# Patient Record
Sex: Female | Born: 1935
Health system: Southern US, Community
[De-identification: ages and names within clinical notes are randomized; demographics above are authoritative.]

## PROBLEM LIST (undated history)

## (undated) DIAGNOSIS — R600 Localized edema: Secondary | ICD-10-CM

## (undated) DIAGNOSIS — E785 Hyperlipidemia, unspecified: Secondary | ICD-10-CM

## (undated) DIAGNOSIS — I10 Essential (primary) hypertension: Secondary | ICD-10-CM

## (undated) DIAGNOSIS — J449 Chronic obstructive pulmonary disease, unspecified: Secondary | ICD-10-CM

## (undated) HISTORY — PX: STAPEDECTOMY: SHX2435

## (undated) HISTORY — PX: HERNIA REPAIR: SHX51

---

## 2004-07-07 ENCOUNTER — Ambulatory Visit: Payer: Self-pay | Admitting: Pain Medicine

## 2004-07-20 ENCOUNTER — Ambulatory Visit: Payer: Self-pay | Admitting: Pain Medicine

## 2004-09-01 ENCOUNTER — Ambulatory Visit: Payer: Self-pay | Admitting: Pain Medicine

## 2004-09-14 ENCOUNTER — Ambulatory Visit: Payer: Self-pay | Admitting: Pain Medicine

## 2004-10-20 ENCOUNTER — Ambulatory Visit: Payer: Self-pay | Admitting: Pain Medicine

## 2004-11-02 ENCOUNTER — Ambulatory Visit: Payer: Self-pay | Admitting: Pain Medicine

## 2005-03-26 ENCOUNTER — Ambulatory Visit: Payer: Self-pay | Admitting: Internal Medicine

## 2005-07-28 ENCOUNTER — Ambulatory Visit: Payer: Self-pay | Admitting: Gastroenterology

## 2005-08-10 ENCOUNTER — Ambulatory Visit: Payer: Self-pay | Admitting: General Surgery

## 2005-08-10 ENCOUNTER — Other Ambulatory Visit: Payer: Self-pay

## 2005-08-16 ENCOUNTER — Ambulatory Visit: Payer: Self-pay | Admitting: General Surgery

## 2005-11-01 ENCOUNTER — Ambulatory Visit: Payer: Self-pay | Admitting: Gastroenterology

## 2005-11-23 ENCOUNTER — Ambulatory Visit: Payer: Self-pay | Admitting: Pain Medicine

## 2005-12-01 ENCOUNTER — Ambulatory Visit: Payer: Self-pay | Admitting: Pain Medicine

## 2006-02-07 ENCOUNTER — Ambulatory Visit: Payer: Self-pay | Admitting: Gastroenterology

## 2006-03-28 ENCOUNTER — Ambulatory Visit: Payer: Self-pay | Admitting: Internal Medicine

## 2006-10-12 ENCOUNTER — Other Ambulatory Visit: Payer: Self-pay

## 2006-10-12 ENCOUNTER — Ambulatory Visit: Payer: Self-pay | Admitting: Obstetrics and Gynecology

## 2006-10-24 ENCOUNTER — Ambulatory Visit: Payer: Self-pay | Admitting: Obstetrics and Gynecology

## 2007-02-20 ENCOUNTER — Ambulatory Visit: Payer: Self-pay | Admitting: Gastroenterology

## 2007-11-22 ENCOUNTER — Ambulatory Visit: Payer: Self-pay | Admitting: Internal Medicine

## 2008-08-13 IMAGING — CR DG CHEST 2V
1 series · 2 of 2 positions shown · non-contrast
Comparison: none

REASON FOR EXAM: sob,htn
COMMENTS:

[Series 1: view not recorded · 0.17mm/px · 2 of 2 slices shown]
[im 1/2]
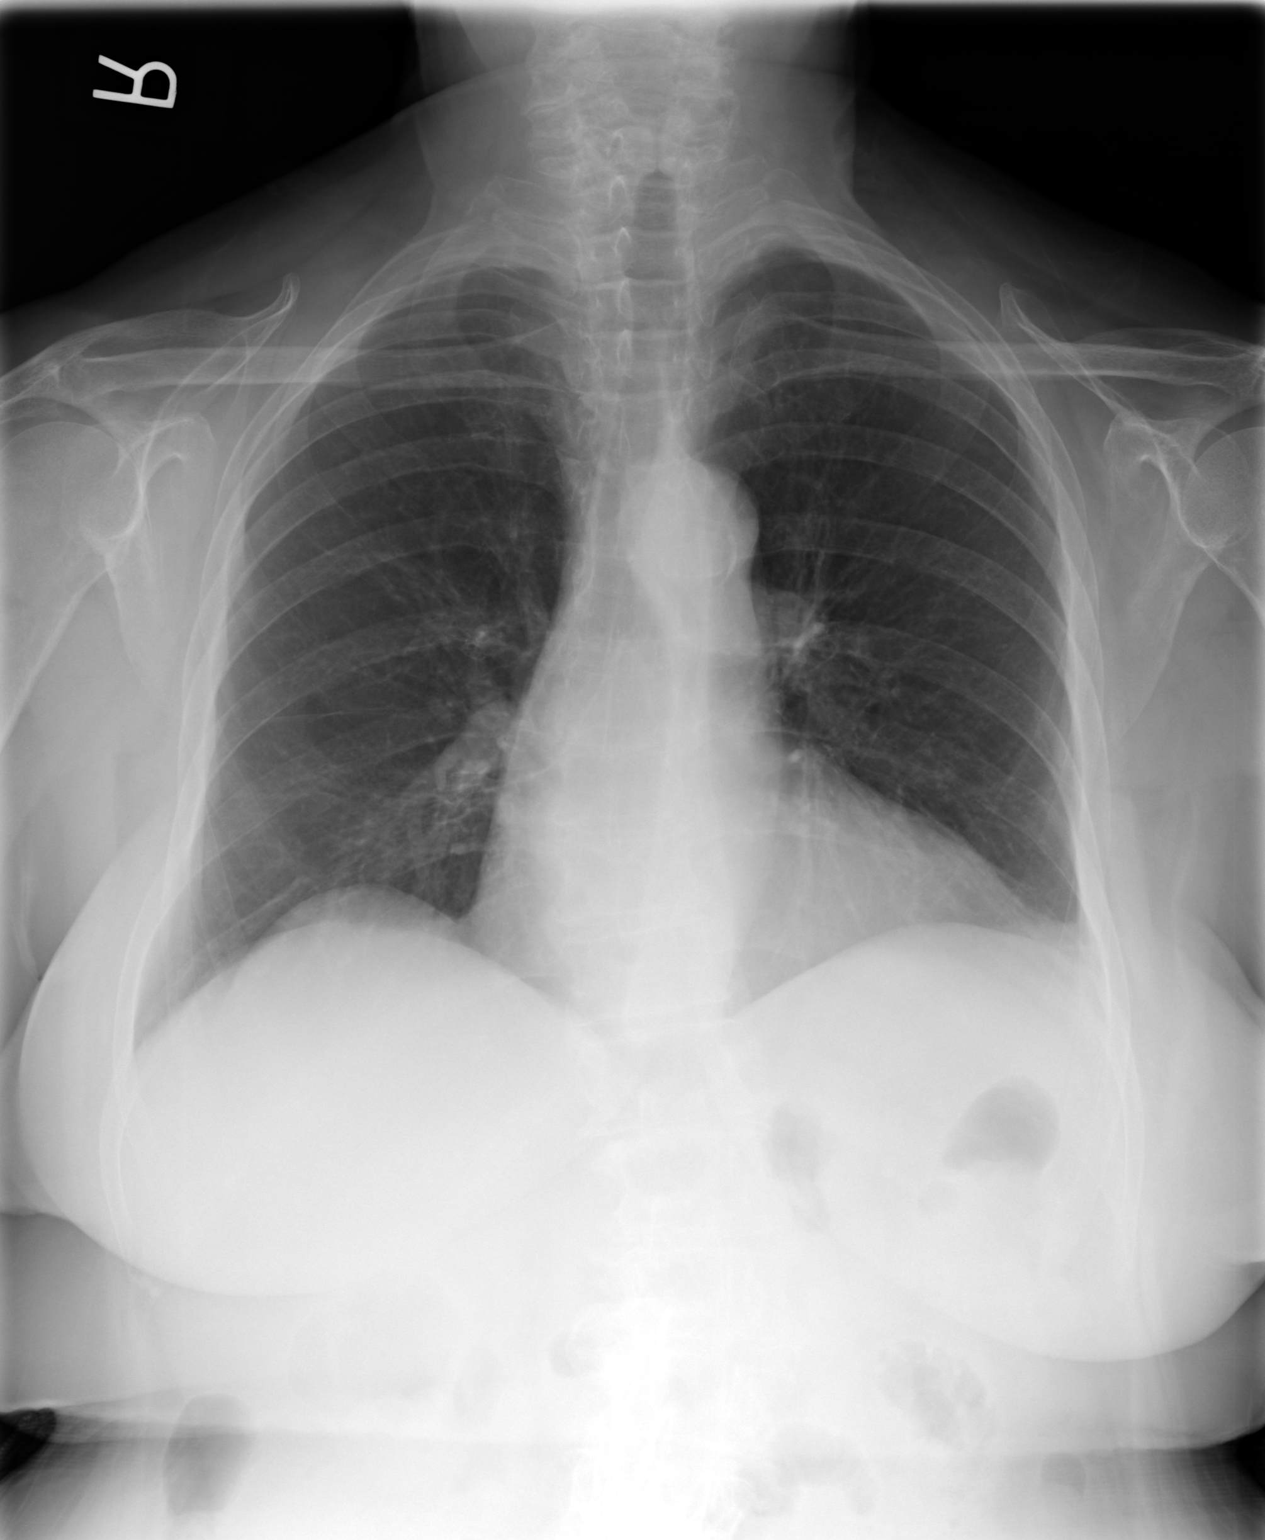
[im 2/2]
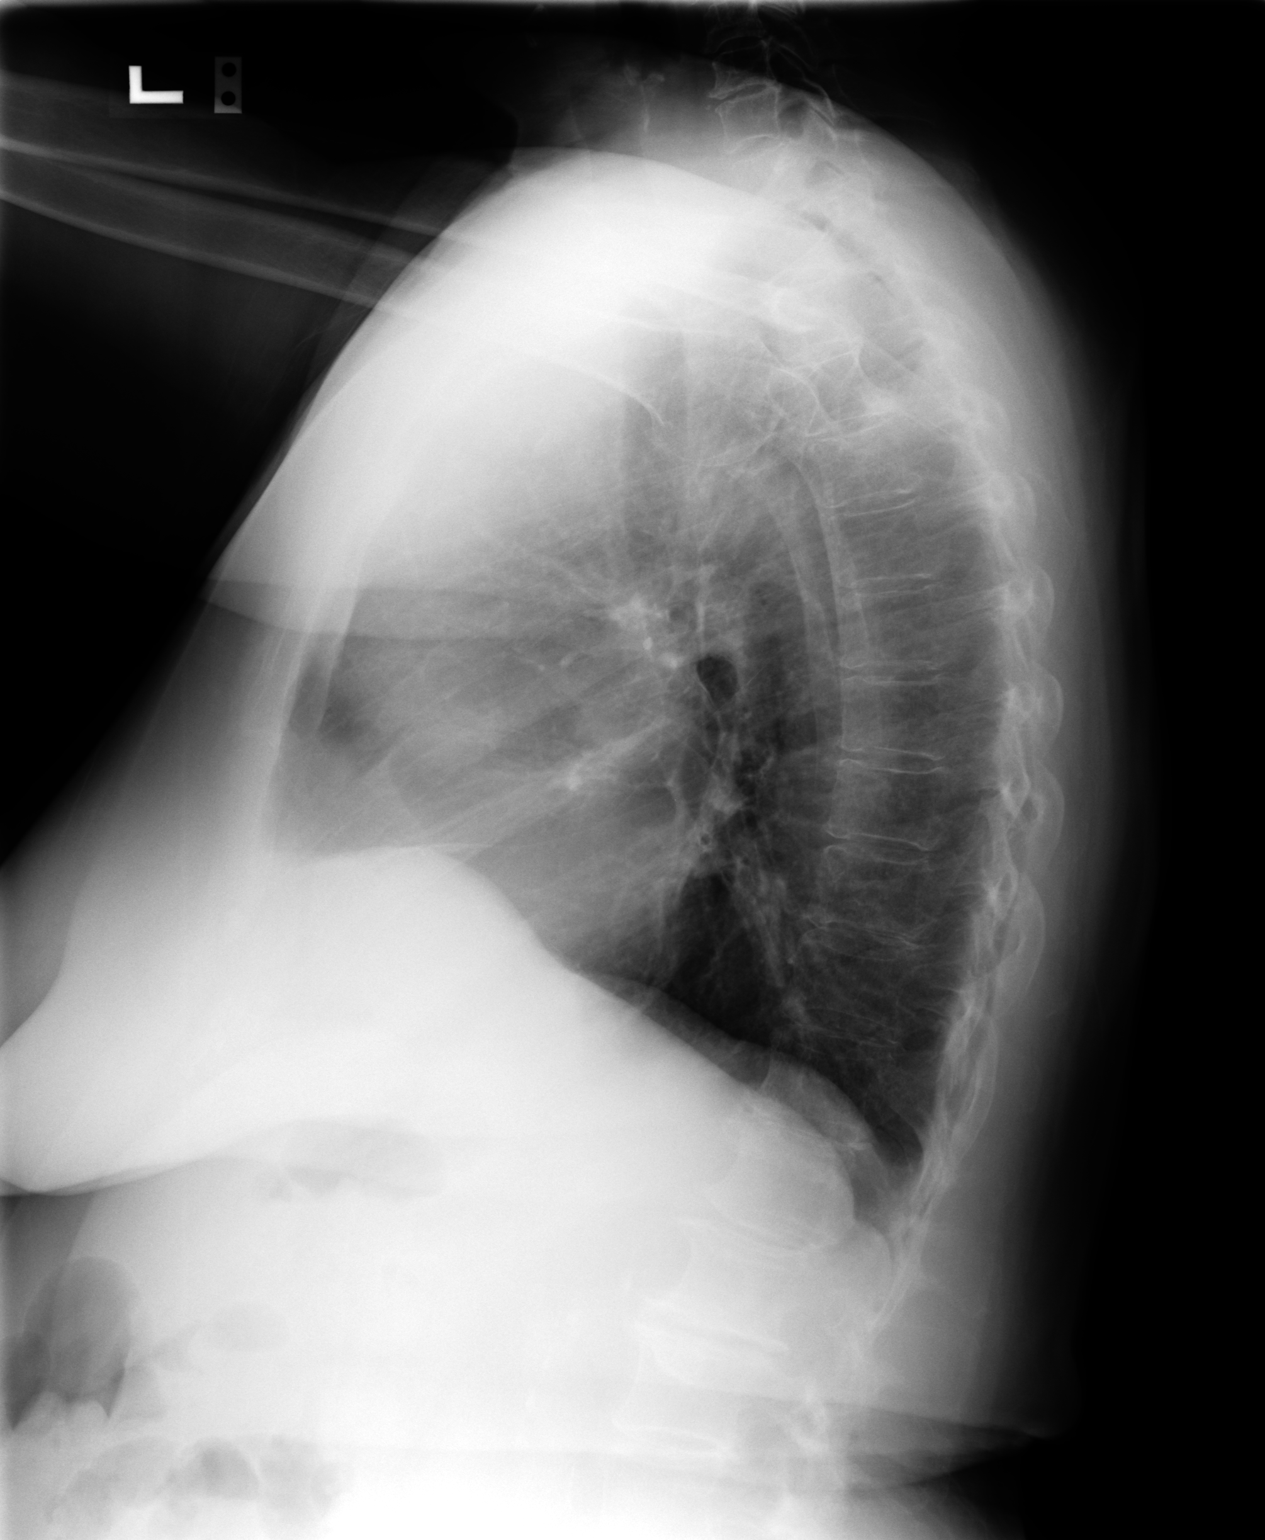

[2 of 2 positions shown; findings below may reference images not displayed]

PROCEDURE:     DXR - DXR CHEST PA (OR AP) AND LATERAL  - October 12, 2006 [DATE]

RESULT:       Comparison is made to a study 10 August, 2005.

The lungs are adequately inflated. Mild hemidiaphragm flattening on the
lateral film is seen but this is not a new finding. The cardiac silhouette
is at the upper limits of normal for size but this too appears stable. The
pulmonary vascularity is not engorged. I see no evidence of a pleural
effusion. There is mild tortuosity of the descending thoracic aorta.
IMPRESSION: I do not see evidence of acute cardiopulmonary disease.

## 2008-11-22 ENCOUNTER — Ambulatory Visit: Payer: Self-pay | Admitting: Internal Medicine

## 2009-12-05 ENCOUNTER — Ambulatory Visit: Payer: Self-pay | Admitting: Internal Medicine

## 2011-02-09 ENCOUNTER — Ambulatory Visit: Payer: Self-pay | Admitting: Internal Medicine

## 2012-02-11 ENCOUNTER — Ambulatory Visit: Payer: Self-pay | Admitting: Internal Medicine

## 2020-07-08 ENCOUNTER — Ambulatory Visit: Payer: Self-pay

## 2020-07-15 ENCOUNTER — Ambulatory Visit: Payer: Medicare Other | Attending: Internal Medicine

## 2020-07-15 ENCOUNTER — Other Ambulatory Visit: Payer: Self-pay | Admitting: Internal Medicine

## 2020-07-15 DIAGNOSIS — Z23 Encounter for immunization: Secondary | ICD-10-CM

## 2020-07-15 NOTE — Progress Notes (Signed)
   Covid-19 Vaccination Clinic  Name:  AVYA FLAVELL    MRN: [id number] DOB: Feb 01, 1927  07/15/2020  Ms. Quirion was observed post Covid-19 immunization for 15 minutes without incident. She was provided with Vaccine Information Sheet and instruction to access the V-Safe system.   Ms. Silverthorne was instructed to call 911 with any severe reactions post vaccine: Marland Kitchen Difficulty breathing  . Swelling of face and throat  . A fast heartbeat  . A bad rash all over body  . Dizziness and weakness   Immunizations Administered    Name Date Dose VIS Date Route   PFIZER Comrnaty(Gray TOP) Covid-19 Vaccine 07/15/2020  1:48 PM 0.3 mL 05/22/2020 Intramuscular   Manufacturer: [employer], [employer]   Lot: [id number]   NDC: [phone]

## 2020-12-05 ENCOUNTER — Ambulatory Visit: Payer: Self-pay | Attending: Internal Medicine

## 2020-12-05 ENCOUNTER — Other Ambulatory Visit: Payer: Self-pay

## 2020-12-05 ENCOUNTER — Ambulatory Visit: Payer: Self-pay

## 2020-12-05 DIAGNOSIS — Z23 Encounter for immunization: Secondary | ICD-10-CM

## 2020-12-05 MED ORDER — PFIZER-BIONT COVID-19 VAC-TRIS 30 MCG/0.3ML IM SUSP
INTRAMUSCULAR | 0 refills | Status: DC
  Filled 2020-12-05: qty 0.3, 1d supply, fill #0

## 2021-03-13 ENCOUNTER — Ambulatory Visit: Payer: Self-pay

## 2021-03-27 ENCOUNTER — Ambulatory Visit: Payer: Self-pay

## 2021-04-07 ENCOUNTER — Ambulatory Visit: Payer: Self-pay | Attending: Internal Medicine

## 2021-04-07 ENCOUNTER — Other Ambulatory Visit: Payer: Self-pay

## 2021-04-07 DIAGNOSIS — Z23 Encounter for immunization: Secondary | ICD-10-CM

## 2021-04-07 MED ORDER — PFIZER COVID-19 VAC BIVALENT 30 MCG/0.3ML IM SUSP
INTRAMUSCULAR | 0 refills | Status: DC
  Filled 2021-04-07: qty 0.3, 1d supply, fill #0

## 2021-04-07 NOTE — Progress Notes (Signed)
   Covid-19 Vaccination Clinic  Name:  Kelly Hawkins    MRN: [id number] DOB: Oct 12, 1926  04/07/2021  [name] was observed post Covid-19 immunization for 15 minutes without incident. She was provided with Vaccine Information Sheet and instruction to access the V-Safe system.   [name] was instructed to call 911 with any severe reactions post vaccine: Difficulty breathing  Swelling of face and throat  A fast heartbeat  A bad rash all over body  Dizziness and weakness   Immunizations Administered     Name Date Dose VIS Date Route   Pfizer Covid-19 Vaccine Bivalent Booster 04/07/2021  1:06 PM 0.3 mL 02/11/2021 Intramuscular   Manufacturer: [employer], [employer]   Lot: [id number]   NDC: [address]-8      Drusilla Kanner, PharmD, MBA Clinical Acute Care Pharmacist

## 2022-05-04 ENCOUNTER — Encounter: Payer: Self-pay | Admitting: Radiology

## 2022-05-04 ENCOUNTER — Other Ambulatory Visit: Payer: Self-pay

## 2022-05-04 ENCOUNTER — Emergency Department: Payer: Medicare Other

## 2022-05-04 ENCOUNTER — Observation Stay: Payer: Medicare Other

## 2022-05-04 ENCOUNTER — Observation Stay
Admit: 2022-05-04 | Discharge: 2022-05-04 | Disposition: A | Discharge status: Home / Self Care | Payer: Medicare Other | Attending: Cardiology | Admitting: Cardiology

## 2022-05-04 ENCOUNTER — Inpatient Hospital Stay
Admission: EM | Admit: 2022-05-04 | Discharge: 2022-05-08 | DRG: 069 | Disposition: A | Discharge status: Home Health | Payer: Medicare Other | Attending: Internal Medicine | Admitting: Internal Medicine

## 2022-05-04 DIAGNOSIS — Z79899 Other long term (current) drug therapy: Secondary | ICD-10-CM

## 2022-05-04 DIAGNOSIS — G0491 Myelitis, unspecified: Secondary | ICD-10-CM | POA: Diagnosis present

## 2022-05-04 DIAGNOSIS — J449 Chronic obstructive pulmonary disease, unspecified: Secondary | ICD-10-CM | POA: Diagnosis not present

## 2022-05-04 DIAGNOSIS — M7122 Synovial cyst of popliteal space [Baker], left knee: Secondary | ICD-10-CM | POA: Diagnosis present

## 2022-05-04 DIAGNOSIS — R29898 Other symptoms and signs involving the musculoskeletal system: Principal | ICD-10-CM

## 2022-05-04 DIAGNOSIS — I639 Cerebral infarction, unspecified: Secondary | ICD-10-CM | POA: Diagnosis present

## 2022-05-04 DIAGNOSIS — E785 Hyperlipidemia, unspecified: Secondary | ICD-10-CM | POA: Diagnosis present

## 2022-05-04 DIAGNOSIS — R29818 Other symptoms and signs involving the nervous system: Secondary | ICD-10-CM | POA: Diagnosis present

## 2022-05-04 DIAGNOSIS — I480 Paroxysmal atrial fibrillation: Secondary | ICD-10-CM | POA: Diagnosis present

## 2022-05-04 DIAGNOSIS — Z886 Allergy status to analgesic agent status: Secondary | ICD-10-CM

## 2022-05-04 DIAGNOSIS — R232 Flushing: Secondary | ICD-10-CM | POA: Diagnosis not present

## 2022-05-04 DIAGNOSIS — G459 Transient cerebral ischemic attack, unspecified: Principal | ICD-10-CM | POA: Diagnosis present

## 2022-05-04 DIAGNOSIS — I10 Essential (primary) hypertension: Secondary | ICD-10-CM | POA: Diagnosis present

## 2022-05-04 DIAGNOSIS — R600 Localized edema: Secondary | ICD-10-CM | POA: Diagnosis present

## 2022-05-04 DIAGNOSIS — R4789 Other speech disturbances: Secondary | ICD-10-CM

## 2022-05-04 DIAGNOSIS — Z8673 Personal history of transient ischemic attack (TIA), and cerebral infarction without residual deficits: Secondary | ICD-10-CM

## 2022-05-04 DIAGNOSIS — Z833 Family history of diabetes mellitus: Secondary | ICD-10-CM

## 2022-05-04 DIAGNOSIS — I4891 Unspecified atrial fibrillation: Secondary | ICD-10-CM | POA: Diagnosis not present

## 2022-05-04 DIAGNOSIS — Z7189 Other specified counseling: Secondary | ICD-10-CM

## 2022-05-04 DIAGNOSIS — Z515 Encounter for palliative care: Secondary | ICD-10-CM

## 2022-05-04 DIAGNOSIS — G8191 Hemiplegia, unspecified affecting right dominant side: Secondary | ICD-10-CM | POA: Diagnosis present

## 2022-05-04 DIAGNOSIS — H9190 Unspecified hearing loss, unspecified ear: Secondary | ICD-10-CM | POA: Diagnosis present

## 2022-05-04 DIAGNOSIS — I672 Cerebral atherosclerosis: Secondary | ICD-10-CM | POA: Diagnosis present

## 2022-05-04 DIAGNOSIS — R471 Dysarthria and anarthria: Secondary | ICD-10-CM | POA: Diagnosis present

## 2022-05-04 DIAGNOSIS — R29810 Facial weakness: Secondary | ICD-10-CM | POA: Diagnosis present

## 2022-05-04 DIAGNOSIS — R29708 NIHSS score 8: Secondary | ICD-10-CM | POA: Diagnosis present

## 2022-05-04 DIAGNOSIS — T45525A Adverse effect of antithrombotic drugs, initial encounter: Secondary | ICD-10-CM | POA: Diagnosis not present

## 2022-05-04 DIAGNOSIS — R4701 Aphasia: Secondary | ICD-10-CM | POA: Diagnosis present

## 2022-05-04 DIAGNOSIS — Z66 Do not resuscitate: Secondary | ICD-10-CM | POA: Diagnosis present

## 2022-05-04 DIAGNOSIS — L8990 Pressure ulcer of unspecified site, unspecified stage: Secondary | ICD-10-CM | POA: Insufficient documentation

## 2022-05-04 HISTORY — DX: Localized edema: R60.0

## 2022-05-04 HISTORY — DX: Hyperlipidemia, unspecified: E78.5

## 2022-05-04 HISTORY — DX: Chronic obstructive pulmonary disease, unspecified: J44.9

## 2022-05-04 HISTORY — DX: Essential (primary) hypertension: I10

## 2022-05-04 LAB — COMPREHENSIVE METABOLIC PANEL
ALT: 14 U/L (ref 0–44)
AST: 27 U/L (ref 15–41)
Albumin: 3.7 g/dL (ref 3.5–5.0)
Alkaline Phosphatase: 35 U/L — ABNORMAL LOW (ref 38–126)
Anion gap: 8 (ref 5–15)
BUN: 29 mg/dL — ABNORMAL HIGH (ref 8–23)
CO2: 26 mmol/L (ref 22–32)
Calcium: 9.8 mg/dL (ref 8.9–10.3)
Chloride: 108 mmol/L (ref 98–111)
Creatinine, Ser: 0.92 mg/dL (ref 0.44–1.00)
GFR, Estimated: 58 mL/min — ABNORMAL LOW (ref >60)
Glucose, Bld: 96 mg/dL (ref 70–99)
Potassium: 4 mmol/L (ref 3.5–5.1)
Sodium: 142 mmol/L (ref 135–145)
Total Bilirubin: 1.1 mg/dL (ref 0.3–1.2)
Total Protein: 6.8 g/dL (ref 6.5–8.1)

## 2022-05-04 LAB — DIFFERENTIAL
Abs Immature Granulocytes: 0.03 10*3/uL (ref 0.00–0.07)
Basophils Absolute: 0 10*3/uL (ref 0.0–0.1)
Basophils Relative: 0 %
Eosinophils Absolute: 0.1 10*3/uL (ref 0.0–0.5)
Eosinophils Relative: 1 %
Immature Granulocytes: 0 %
Lymphocytes Relative: 19 %
Lymphs Abs: 1.8 10*3/uL (ref 0.7–4.0)
Monocytes Absolute: 0.9 10*3/uL (ref 0.1–1.0)
Monocytes Relative: 10 %
Neutro Abs: 6.5 10*3/uL (ref 1.7–7.7)
Neutrophils Relative %: 70 %

## 2022-05-04 LAB — CBC
HCT: 37.7 % (ref 36.0–46.0)
Hemoglobin: 12.1 g/dL (ref 12.0–15.0)
MCH: 29.6 pg (ref 26.0–34.0)
MCHC: 32.1 g/dL (ref 30.0–36.0)
MCV: 92.2 fL (ref 80.0–100.0)
Platelets: 398 10*3/uL (ref 150–400)
RBC: 4.09 MIL/uL (ref 3.87–5.11)
RDW: 14.4 % (ref 11.5–15.5)
WBC: 9.3 10*3/uL (ref 4.0–10.5)
nRBC: 0 % (ref 0.0–0.2)

## 2022-05-04 LAB — HEMOGLOBIN A1C
Hgb A1c MFr Bld: 4.7 % — ABNORMAL LOW (ref 4.8–5.6)
Mean Plasma Glucose: 88.19 mg/dL

## 2022-05-04 LAB — ETHANOL: Alcohol, Ethyl (B): <10 mg/dL (ref <10)

## 2022-05-04 LAB — CBG MONITORING, ED: Glucose-Capillary: 88 mg/dL (ref 70–99)

## 2022-05-04 LAB — TSH: TSH: 0.882 u[IU]/mL (ref 0.350–4.500)

## 2022-05-04 LAB — APTT: aPTT: 27 seconds (ref 24–36)

## 2022-05-04 LAB — PROTIME-INR
INR: 1.1 (ref 0.8–1.2)
Prothrombin Time: 14.1 seconds (ref 11.4–15.2)

## 2022-05-04 LAB — BRAIN NATRIURETIC PEPTIDE: B Natriuretic Peptide: 212.6 pg/mL — ABNORMAL HIGH (ref 0.0–100.0)

## 2022-05-04 MED ORDER — HYDRALAZINE HCL 20 MG/ML IJ SOLN: 5.0000 mg | INTRAMUSCULAR | Status: DC | PRN

## 2022-05-04 MED ORDER — TRAMADOL HCL 50 MG PO TABS: 50.0000 mg | ORAL_TABLET | Freq: Three times a day (TID) | ORAL | Status: DC | PRN

## 2022-05-04 MED ORDER — STROKE: EARLY STAGES OF RECOVERY BOOK: Freq: Once | Status: DC

## 2022-05-04 MED ORDER — ACETAMINOPHEN 650 MG RE SUPP: 650.0000 mg | RECTAL | Status: DC | PRN

## 2022-05-04 MED ORDER — CLOPIDOGREL BISULFATE 75 MG PO TABS
75.0000 mg | ORAL_TABLET | Freq: Every day | ORAL | Status: DC
  Administered 2022-05-04: 75 mg via ORAL
  Filled 2022-05-04: qty 1

## 2022-05-04 MED ORDER — VITAMIN D3 25 MCG (1000 UNIT) PO TABS
2000.0000 [IU] | ORAL_TABLET | Freq: Every day | ORAL | Status: DC
  Administered 2022-05-04 – 2022-05-07 (×4): 2000 [IU] via ORAL
  Filled 2022-05-04 (×9): qty 2

## 2022-05-04 MED ORDER — ONDANSETRON HCL 4 MG/2ML IJ SOLN: 4.0000 mg | Freq: Three times a day (TID) | INTRAMUSCULAR | Status: DC | PRN

## 2022-05-04 MED ORDER — ACETAMINOPHEN 325 MG PO TABS: 650.0000 mg | ORAL_TABLET | ORAL | Status: DC | PRN

## 2022-05-04 MED ORDER — FENOFIBRATE 160 MG PO TABS
160.0000 mg | ORAL_TABLET | Freq: Every day | ORAL | Status: DC
  Administered 2022-05-04 – 2022-05-07 (×4): 160 mg via ORAL
  Filled 2022-05-04 (×4): qty 1

## 2022-05-04 MED ORDER — IOHEXOL 350 MG/ML SOLN
50.0000 mL | Freq: Once | INTRAVENOUS | Status: AC | PRN
  Administered 2022-05-04: 50 mL via INTRAVENOUS

## 2022-05-04 MED ORDER — RALOXIFENE HCL 60 MG PO TABS
60.0000 mg | ORAL_TABLET | Freq: Every day | ORAL | Status: DC
  Administered 2022-05-05 – 2022-05-07 (×3): 60 mg via ORAL
  Filled 2022-05-04 (×5): qty 1

## 2022-05-04 MED ORDER — ENOXAPARIN SODIUM 30 MG/0.3ML IJ SOSY
30.0000 mg | PREFILLED_SYRINGE | INTRAMUSCULAR | Status: DC
  Administered 2022-05-04 – 2022-05-06 (×3): 30 mg via SUBCUTANEOUS
  Filled 2022-05-04 (×3): qty 0.3

## 2022-05-04 MED ORDER — VITAMIN B-12 1000 MCG PO TABS
1000.0000 ug | ORAL_TABLET | Freq: Every day | ORAL | Status: DC
  Administered 2022-05-04 – 2022-05-07 (×4): 1000 ug via ORAL
  Filled 2022-05-04: qty 2
  Filled 2022-05-04 (×2): qty 1
  Filled 2022-05-04: qty 2
  Filled 2022-05-04: qty 1

## 2022-05-04 MED ORDER — SODIUM CHLORIDE 0.9% FLUSH
3.0000 mL | Freq: Once | INTRAVENOUS | Status: AC
  Administered 2022-05-04: 3 mL via INTRAVENOUS

## 2022-05-04 MED ORDER — FLUTICASONE PROPIONATE 50 MCG/ACT NA SUSP: 2.0000 | Freq: Every day | NASAL | Status: DC | PRN

## 2022-05-04 MED ORDER — ATORVASTATIN CALCIUM 20 MG PO TABS
40.0000 mg | ORAL_TABLET | Freq: Every day | ORAL | Status: DC
  Administered 2022-05-04 – 2022-05-07 (×4): 40 mg via ORAL
  Filled 2022-05-04 (×4): qty 2

## 2022-05-04 MED ORDER — ACETAMINOPHEN 160 MG/5ML PO SOLN: 650.0000 mg | ORAL | Status: DC | PRN

## 2022-05-04 MED ORDER — SENNOSIDES-DOCUSATE SODIUM 8.6-50 MG PO TABS: 1.0000 | ORAL_TABLET | Freq: Every evening | ORAL | Status: DC | PRN

## 2022-05-04 MED ORDER — METOPROLOL TARTRATE 5 MG/5ML IV SOLN: 2.5000 mg | INTRAVENOUS | Status: DC | PRN

## 2022-05-04 MED ORDER — ALBUTEROL SULFATE HFA 108 (90 BASE) MCG/ACT IN AERS: 2.0000 | INHALATION_SPRAY | Freq: Four times a day (QID) | RESPIRATORY_TRACT | Status: DC | PRN

## 2022-05-04 MED ORDER — STROKE: EARLY STAGES OF RECOVERY BOOK: Freq: Once | Status: AC

## 2022-05-04 MED ORDER — MONTELUKAST SODIUM 10 MG PO TABS
10.0000 mg | ORAL_TABLET | Freq: Every day | ORAL | Status: DC
  Administered 2022-05-04 – 2022-05-06 (×3): 10 mg via ORAL
  Filled 2022-05-04 (×3): qty 1

## 2022-05-04 NOTE — ED Notes (Signed)
Standby assistance to bathroom with walker - returned to bed without event.

## 2022-05-04 NOTE — ED Notes (Signed)
Neuro at bedside for evaluation.

## 2022-05-04 NOTE — Progress Notes (Signed)
Pt family report pt flushing of face occurred after have taken Plavix. MD, Jarvis Morgan, notified of report from family. No other concerns addressed. See admission assessment for other assessment findings. Will continue to monitor.

## 2022-05-04 NOTE — ED Notes (Signed)
Neuro at bedside - family at bedside.  Per family patient was walking back from bathroom when she developed right sided weakness and speech difficulties. Woke up normal and symptoms started around 0800.

## 2022-05-04 NOTE — ED Provider Notes (Signed)
[facility] Provider Note    Event Date/Time   First MD Initiated Contact with Patient 05/04/22 [phone]     (approximate)   History   Facial Droop   HPI  Kelly Hawkins is a 88 y.o. female   Past medical history of COPD, hypertension, chronic pain presents to the emergency department with facial droop right arm weakness that was transient this morning.  She awoke at 5:30 AM in her normal state of health without any complaints and just after 8 AM developed word finding difficulties and right arm weakness.  The family noticed a right-sided facial droop as well.  These symptoms have resolved by the time she come to the emergency department.  Regular state of health in the last few days without trauma or recent illnesses.  She has ambulation difficulties at baseline and ambulates with a walker.  History was obtained via the patient. Her daughter is at bedside as an independent historian to offer collateral information, states that the patient has been in her regular state of health and was without complaints this morning upon waking, and note that symptoms developed around 8 AM, and have resolved at this time.  She appears to be at baseline now, they states she is very hard of hearing at baseline.      Physical Exam   Triage Vital Signs: ED Triage Vitals  Enc Vitals Group     BP      Pulse      Resp      Temp      Temp src      SpO2      Weight      Height      Head Circumference      Peak Flow      Pain Score      Pain Loc      Pain Edu?      Excl. in GC?     Most recent vital signs: Vitals:   05/04/22 0950  BP: (!) 154/95  Pulse: 90  Resp: (!) 21  Temp: 97.9 F (36.6 C)  SpO2: 100%    General: Awake, no distress.  CV:  Good peripheral perfusion.  Resp:  Normal effort.  Breath sounds clear Abd:  No distention.  Other:  She has no facial asymmetry, no dysarthria, mentation is clear alert and oriented but hard of hearing.  Sensation is  intact and equal to bilateral extremities and face, extraocular movements are intact and she has no visual cuts.  Strength is full in bilateral lower extremities and she has no asymmetry to strength testing to her upper extremities but equal bilateral upper extremities - unable to keep either side up for 5 seconds, begins to drift unclear whether due to weakness or pain.   ED Results / Procedures / Treatments   Labs (all labs ordered are listed, but only abnormal results are displayed) Labs Reviewed  CBC  DIFFERENTIAL  PROTIME-INR  APTT  COMPREHENSIVE METABOLIC PANEL  ETHANOL  CBG MONITORING, ED  I-STAT CREATININE, ED     I reviewed labs and they are notable for glucose 88  EKG  ED ECG REPORT I, Pilar Jarvis, the attending physician, personally viewed and interpreted this ECG.   Date: 05/04/2022  EKG Time: 1001  Rate: 90  Rhythm: atrial fibrillation, rate 90  Intervals:rbbb  ST&T Change: no acute ischemic changes    PROCEDURES:  Critical Care performed: Yes, see critical care procedure note(s)  .Critical Care  Performed by: Pilar Jarvis, MD Authorized by: Pilar Jarvis, MD   Critical care provider statement:    Critical care time (minutes):  30   Critical care was necessary to treat or prevent imminent or life-threatening deterioration of the following conditions:  CNS failure or compromise (stroke)   Critical care was time spent personally by me on the following activities:  Development of treatment plan with patient or surrogate, discussions with consultants, evaluation of patient's response to treatment, examination of patient, ordering and review of laboratory studies, ordering and review of radiographic studies, ordering and performing treatments and interventions, pulse oximetry, re-evaluation of patient's condition and review of old charts    MEDICATIONS ORDERED IN ED: Medications  sodium chloride flush (NS) 0.9 % injection 3 mL (has no administration in time  range)  iohexol (OMNIPAQUE) 350 MG/ML injection 50 mL (50 mLs Intravenous Contrast Given 05/04/22 1011)    Consultants:  I spoke with Dr. Bing Neighbors of neurology regarding care plan for this patient.   IMPRESSION / MDM / ASSESSMENT AND PLAN / ED COURSE  I reviewed the triage vital signs and the nursing notes.                              Differential diagnosis includes, but is not limited to, TIA, CVA, intracranial bleed, electrolyte disturbance, infection, hypoglycemia   The patient is on the cardiac monitor to evaluate for evidence of arrhythmia and/or significant heart rate changes.  MDM: Acute neurological deficits that have resolved with no focal deficits fou nd on my exam at this time.  Concern for TIA, stroke, but given resolved symptoms at this time not a thrombolytic candidate despite being within 4-hour window.  We will obtain a CTA of the head and neck stat in the emergency department along with stroke labs.  She will need admission for TIA work-up. She appears to be in new AF; rate ok and normal BP may be cardiac embolic source - hold [facility] for now until  MRI complete per neuro.   I spoke with Dr. Bing Neighbors of neurology who agree with work-up as above, will need admission for TIA work-up.  Patient's presentation is most consistent with acute presentation with potential threat to life or bodily function.       FINAL CLINICAL IMPRESSION(S) / ED DIAGNOSES   Final diagnoses:  Right arm weakness  Word finding difficulty  Atrial fibrillation, unspecified type (HCC)     Rx / DC Orders   ED Discharge Orders     None        Note:  This document was prepared using Dragon voice recognition software and may include unintentional dictation errors.    Pilar Jarvis, MD 05/04/22 1028

## 2022-05-04 NOTE — ED Notes (Signed)
Notified Dr. Clyde Lundborg and SLP about failed swallow screen

## 2022-05-04 NOTE — Progress Notes (Signed)
PHARMACIST - PHYSICIAN COMMUNICATION  CONCERNING:  Enoxaparin (Lovenox) for DVT Prophylaxis    RECOMMENDATION: Patient was prescribed enoxaprin 40mg  q24 hours for VTE prophylaxis.   Filed Weights   05/04/22 0958  Weight: 49 kg (108 lb)    Body mass index is 24.29 kg/m.  Estimated Creatinine Clearance: 24.9 mL/min (by C-G formula based on SCr of 0.92 mg/dL).   Patient is candidate for enoxaparin 30mg  every 24 hours based on CrCl <65ml/min or Weight <45kg  DESCRIPTION: Pharmacy has adjusted enoxaparin dose per [facility] policy.  Patient is now receiving enoxaparin 30 mg every 24 hours    31m, PharmD Clinical Pharmacist  05/04/2022 12:25 PM

## 2022-05-04 NOTE — Evaluation (Signed)
Physical Therapy Evaluation Patient Details Name: Kelly Hawkins MRN: [id number] DOB: 1927/01/13 Today's Date: 05/04/2022  History of Present Illness  Pt admitted to [facility] on 05/04/22 for c/o stroke like symptoms including: RUE weakness, facial droop, and word finding difficulties. Symptoms resolved by the time she came to the ED. Per Dr. Modesto Charon this is not a code stroke, but requested CT imaging. Significant PMH includes: COPD, HTN, and chronic pain.    Clinical Impression  Pt is a 88 year old F admitted to hospital on 05/04/22 for stroke like symptoms. At baseline, pt was mod I for transfers, gait with RW, most ADL's, and feeding. Family assists with WC mobility (ramped to entrance), IADL's, cutting food, grocery shopping, and medication set up.   Pt presents with generalized weakness, limited shoulder AROM (chronic), decreased gross balance, decreased activity tolerance, decreased cardiopulmonary/vascular endurance with activity, and c/o N/T in RUE, resulting in impaired functional mobility. Due to deficits, pt required mod assist for bed mobility, CGA-min assist for transfers with RW, and CGA to ambulate 43ft x2 with RW. Increased time/effort required for all functional mobility and self-care ADL's. Able to don/doff socks with CGA for balance in figure 4 position, and was CGA for toileting hygiene. Further mobility limited secondary to tachycardia with HR in 120's (HR max of 127bpm based on age) and fatigue, with RPE og 6-8/10 indicating "moderate-vigorous exercise."   Deficits limit the pt's ability to safely and independently perform ADL's, transfer, and ambulate. Pt will benefit from acute skilled PT services to address deficits for return to baseline function. Daughter states that while she is able to provide transportation, pt has had an increasingly harder time safely entering/exiting home. Therefore, PT to recommend HHPT. If unable to complete HHPT, will recommend OPPT to address strength and  balance deficits for improved safety/Ind with functional mobility and ADL's, as well as decreased caregiver burden.   Pt will benefit from [facility] for improved safety and energy conservation with toileting at home, due to significant fatigue today with minimal mobility. Pt will also benefit from a shower chair for safety and energy conservation with bathing. Pt's tub shower and WIS are both too narrow to accommodate a BSC/3in1 as a shower chair. Although she sponge bathes at baseline, she would like to be able to bathe in the shower again with PRN assistance from daughter. Pt currently does not have the balance/endurance to bathe in standing.        Recommendations for follow up therapy are one component of a multi-disciplinary discharge planning process, led by the attending physician.  Recommendations may be updated based on patient status, additional functional criteria and insurance authorization.  Follow Up Recommendations Home health PT (vs. OPPT)      Assistance Recommended at Discharge Intermittent Supervision/Assistance  Patient can return home with the following  A little help with walking and/or transfers;A little help with bathing/dressing/bathroom;Assistance with cooking/housework;Direct supervision/assist for medications management;Direct supervision/assist for financial management;Assist for transportation;Help with stairs or ramp for entrance    Equipment Recommendations BSC/3in1 (shower chair)     Functional Status Assessment Patient has had a recent decline in their functional status and demonstrates the ability to make significant improvements in function in a reasonable and predictable amount of time.     Precautions / Restrictions Precautions Precautions: Fall Restrictions Other Position/Activity Restrictions: HOH, O2 >/= 94%      Mobility  Bed Mobility Overal bed mobility: Needs Assistance Bed Mobility: Supine to Sit, Sit to Supine  Supine to sit: Mod  assist Sit to supine: Mod assist   General bed mobility comments: mod assist for hips/trunk to sit EOB, HOB slightly elevated. Mod assist for hips/BLE to lie supine.    Transfers Overall transfer level: Needs assistance   Transfers: Sit to/from Stand Sit to Stand: Min assist, Min guard           General transfer comment: min guard from elevated bed height with RW and min assist for power to stand from low height commode. min assist for controlled descent with UE support    Ambulation/Gait Ambulation/Gait assistance: Min guard Gait Distance (Feet): 30 Feet (48ft x2 (EOB<>commode)) Assistive device: Rolling walker (2 wheels)         General Gait Details: CGA for safety to ambulate to/from commode for toileting. Demonstrates slowed cadence, decreased RW proximity, kyphotic posture, decreased step length/foot clearance bil, and narrow BOS.     Balance Overall balance assessment: Needs assistance Sitting-balance support: Bilateral upper extremity supported Sitting balance-Leahy Scale: Fair Sitting balance - Comments: CGA for safety but able to be supervision with BUE support with static balance; c/o decreased balance. LOB to the right when doffing socks in figure 4 position without UE support   Standing balance support: Bilateral upper extremity supported, During functional activity   Standing balance comment: intermittent ranging from poor-fair; BUE support on RW                             Pertinent Vitals/Pain Pain Assessment Pain Assessment: No/denies pain    Home Living Family/patient expects to be discharged to:: Private residence Living Arrangements: Children (daughter and adult grandson) Available Help at Discharge: Family;Available 24 hours/day (daughter/grandson and 2 son's (live nearby)) Type of Home: House Home Access: Ramped entrance       Home Layout: One level Home Equipment: Agricultural consultant (2 wheels);Cane - single point;Wheelchair -  manual;BSC/3in1      Prior Function               Mobility Comments: Has been requiring assist from family members for bed mobility; is normall mod I with RW for transfers and limited ambulation. Assist for WC mobility on ramp when entering/exiting home. ADLs Comments: Sponge baths at baseline; daughter to assist with some UB dressing due to limited shoulder AROM. Otherwise mod I with ADL's and feeding. Assist for IADL's, cutting up food, and medication set up.     Hand Dominance   Dominant Hand: Right    Extremity/Trunk Assessment   Upper Extremity Assessment Upper Extremity Assessment: Generalized weakness (Shoulder AROM limited bil to about 80deg (Chronic); PROM WNL. Shoulder flexion 3+/5, elbow flex/ext 4/5, and grip 4+/5. sensation intact)    Lower Extremity Assessment Lower Extremity Assessment: Generalized weakness;RLE deficits/detail (LLE: grossly 4/5) RLE Deficits / Details: hip flexion AROM limited secondary to weakness; otherwise grossly WFL. hip flexion 3+/5, knee ext 3+/5, knee flex 4/5, DF 4+/5 RLE Sensation: WNL RLE Coordination: WNL          Cognition Arousal/Alertness: Awake/alert Behavior During Therapy: WFL for tasks assessed/performed Overall Cognitive Status: Within Functional Limits for tasks assessed                                 General Comments: HOH; A&O x4; able to follow 100% of 2-step commands        General Comments General comments (skin integrity, edema,  etc.): HR elevated into 120's during gait. No c/o dizziness and/or lightheadedness. Increased time/effort to complete tasks. Mild swelling in BLE (1+ non pitting) with LLE having mild erythema; no TTP    Exercises Other Exercises Other Exercises: Participates in bed mobility, transfers, and gait with RW. Completes toileting with CGA-supervision and able to don/doff socks in sitting (Figure 4 position) with CGA for balance. Other Exercises: Pt/family educated re: PT  role/POC, DC recommendations, safety with mobility, DME needs, benefits of OOB mobility, elevated HR.   Assessment/Plan    PT Assessment Patient needs continued PT services  PT Problem List Decreased strength;Decreased range of motion;Decreased activity tolerance;Decreased balance;Cardiopulmonary status limiting activity;Decreased skin integrity       PT Treatment Interventions DME instruction;Gait training;Functional mobility training;Therapeutic activities;Therapeutic exercise;Balance training;Neuromuscular re-education    PT Goals (Current goals can be found in the Care Plan section)  Acute Rehab PT Goals Patient Stated Goal: "go home" PT Goal Formulation: With patient/family Time For Goal Achievement: 05/18/22 Potential to Achieve Goals: Good    Frequency Min 2X/week        AM-PAC PT "6 Clicks" Mobility  Outcome Measure Help needed turning from your back to your side while in a flat bed without using bedrails?: A Lot Help needed moving from lying on your back to sitting on the side of a flat bed without using bedrails?: A Lot Help needed moving to and from a bed to a chair (including a wheelchair)?: A Little Help needed standing up from a chair using your arms (e.g., wheelchair or bedside chair)?: A Little Help needed to walk in hospital room?: A Little Help needed climbing 3-5 steps with a railing? : A Lot 6 Click Score: 15    End of Session Equipment Utilized During Treatment: Gait belt Activity Tolerance: Patient tolerated treatment well;Patient limited by fatigue Patient left: in bed;with call bell/phone within reach;with family/visitor present Nurse Communication: Mobility status PT Visit Diagnosis: Unsteadiness on feet (R26.81);Muscle weakness (generalized) (M62.81);Difficulty in walking, not elsewhere classified (R26.2)    Time: 1135-1208 PT Time Calculation (min) (ACUTE ONLY): 33 min   Charges:   PT Evaluation $PT Eval Low Complexity: 1 Low PT  Treatments $Therapeutic Activity: 8-22 mins        Vira Blanco, PT, DPT 12:52 PM,05/04/22 Physical Therapist - [facility]

## 2022-05-04 NOTE — Evaluation (Signed)
Clinical/Bedside Swallow Evaluation Patient Details  Name: Kelly Hawkins MRN: [id number] Date of Birth: 1926-10-17  Today's Date: 05/04/2022 Time: SLP Start Time (ACUTE ONLY): 1510 SLP Stop Time (ACUTE ONLY): 1520 SLP Time Calculation (min) (ACUTE ONLY): 10 min  Past Medical History:  Past Medical History:  Diagnosis Date   Bilateral leg edema    COPD (chronic obstructive pulmonary disease) (HCC)    HLD (hyperlipidemia)    HTN (hypertension)    Past Surgical History:  Past Surgical History:  Procedure Laterality Date   HERNIA REPAIR     STAPEDECTOMY     done 50+ years ago with prosthesis. MRI unsafe due to no records per radiologist   HPI:  Pt admitted to [facility] on 05/04/22 for c/o stroke like symptoms including: RUE weakness, facial droop, and word finding difficulties. Symptoms resolved by the time she came to the ED. Per Dr. Modesto Charon this is not a code stroke, but requested CT imaging. Significant PMH includes: COPD, HTN, and chronic pain.    Assessment / Plan / Recommendation  Clinical Impression  Consult received as pt failed [facility] Swallow Screen as she stopped drinking prior to consuming 3oz of thin liquids. During this evaluation, pt presented with adequate oropharyngeal abilities when consuming thin liquids via straw, puree and graham cracker. As a result, pt's risk of aspiration is reduced when consuming a regular diet with thin liquids, medicine whole with thinliquids, Pt's daughter and son were present at bedside and report no s/s of aspiration when consuming POs prior to admission. Further ST intervention is not indicated for dysphagia.  Family also reports that pt's cognitive communication abilities have returned to baseline.    SLP Visit Diagnosis: Dysphagia, unspecified (R13.10)    Aspiration Risk  No limitations    Diet Recommendation Regular;Thin liquid   Liquid Administration via: Cup;Straw Medication Administration: Whole meds with liquid Supervision: Patient able  to self feed Compensations: Minimize environmental distractions;Slow rate;Small sips/bites Postural Changes: Seated upright at 90 degrees    Other  Recommendations Oral Care Recommendations: Oral care BID    Recommendations for follow up therapy are one component of a multi-disciplinary discharge planning process, led by the attending physician.  Recommendations may be updated based on patient status, additional functional criteria and insurance authorization.  Follow up Recommendations No SLP follow up      Assistance Recommended at Discharge  N/A  Functional Status Assessment Patient has not had a recent decline in their functional status  Frequency and Duration   N/A         Prognosis   N/A     Swallow Study   General Date of Onset: 05/04/22 HPI: Pt admitted to [facility] on 05/04/22 for c/o stroke like symptoms including: RUE weakness, facial droop, and word finding difficulties. Symptoms resolved by the time she came to the ED. Per Dr. Modesto Charon this is not a code stroke, but requested CT imaging. Significant PMH includes: COPD, HTN, and chronic pain. Type of Study: Bedside Swallow Evaluation Previous Swallow Assessment: none in chart Diet Prior to this Study: NPO Temperature Spikes Noted: No Respiratory Status: Room air History of Recent Intubation: No Behavior/Cognition: Alert;Cooperative;Pleasant mood (HOH) Oral Cavity Assessment: Within Functional Limits Oral Care Completed by SLP: No Oral Cavity - Dentition: Adequate natural dentition Vision: Functional for self-feeding Self-Feeding Abilities: Able to feed self Patient Positioning: Upright in bed Baseline Vocal Quality: Normal Volitional Cough: Strong Volitional Swallow: Unable to elicit    Oral/Motor/Sensory Function Overall Oral Motor/Sensory Function: Within functional limits  Ice Chips Ice chips: Not tested   Thin Liquid Thin Liquid: Within functional limits Presentation: Self Fed;Straw    Nectar Thick Nectar Thick  Liquid: Not tested   Honey Thick Honey Thick Liquid: Not tested   Puree Puree: Within functional limits Presentation: Self Fed;Spoon   Solid     Solid: Within functional limits Presentation: Self Fed     Kelly Hawkins B. Kelly Hawkins, M.S., CCC-SLP, Tree surgeon Certified Brain Injury Specialist [facility]  [facility] Office [phone] Ascom [phone] Fax [phone]

## 2022-05-04 NOTE — ED Triage Notes (Addendum)
Arrived via [facility] from home with family. Pt family found pt after brushing teeth and noted facial droop on right side, and pt was leaning toward right and against wall for support. EMS negative stroke screen, symptoms resolved per EMS, vitals stable. NAD at this time.   LKW 0800.  EMS CBG 117  RN noted slight facial droop on right side, notified Dr. Modesto Charon for initial assessment.

## 2022-05-04 NOTE — Consult Note (Addendum)
NEUROLOGY CONSULTATION NOTE   Date of service: May 04, 2022 Patient Name: Kelly Hawkins MRN:  [id number] DOB:  1926/06/18 Reason for consult: transient episode R sided weakness and numbness with aphasia Requesting physician: Dr. Pilar Jarvis _ _ _   _ __   _ __ _ _  __ __   _ __   __ _  History of Present Illness   This is a 88 year old woman with past medical history significant for hypertension, hyperlipidemia, COPD, and bilateral leg edema who presents after episode of right-sided weakness and numbness with aphasia this morning. Patient is extremely hard of hearing limiting ability to get history from her.  Collateral history is obtained from family at bedside.  Eats that patient ambulated to the bathroom with a walker and when she came out she was not speaking.  She was also noted to have right-sided weakness and patient states that at the time her right face and arm were also feeling numb.  EMS was called and while they were in route patient began to speak and was extremely dysarthric with word finding difficulties.  Her speech has now returned to baseline.  She has a persistent right facial droop.  Patient initially stated that she was overall back to baseline and family concurred however on my examination she has objective weakness in her bilateral upper greater than lower extremities.  When family saw my examination and I asked them if this was usual for her they stated that she is weaker than normal.  Last known well is therefore unclear.  Patient is not eligible for TNK as a result.  She has no prior history of A-fib but is noted to be in atrial fibrillation today in the ED.  Anticoagulation or antiplatelets at home.  Imaging in ED:  CT head:   1. No evidence of acute intracranial hemorrhage, acute cortical infarct or intracranial mass. 2. Tiny age-indeterminate lacunar infarct versus prominent perivascular space within the anterior limb of left internal capsule. A brain MRI may be  obtained for further evaluation, as clinically warranted. 3. Small chronic cortically-based infarct within the lateral right occipital lobe (PCA territory). 4. Mild chronic small vessel ischemic changes within the cerebral white matter. 5. Mild generalized cerebral atrophy. 6. Paranasal sinus disease, as described.   CTA neck:   1. The origin of the left common carotid artery is excluded from the field of view. Within this limitation, the common carotid and internal carotid arteries are patent within the neck without stenosis. Mild atherosclerotic plaque, bilaterally. 2. The vertebral arteries are patent within the neck without stenosis. Minimal non-stenotic atherosclerotic plaque within the left V2 segment. 3. Please note, the origin of the innominate artery is also excluded from the field of view. 4. Aortic Atherosclerosis (ICD10-I70.0). 5. Cervical spondylosis, as described.   CTA head:   1. No intracranial large vessel occlusion. 2. Intracranial atherosclerotic disease, as described. Most notably, there is a moderate focal stenosis within the right PCA P2 segment.  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; records not available for review, therefore is unable to undergo MRI   ROS   Per HPI: all other systems reviewed and are negative  Past History   I have reviewed the following:  Past Medical History:  Diagnosis Date   Bilateral leg edema    COPD (chronic obstructive pulmonary disease) (HCC)    HLD (hyperlipidemia)    HTN (  hypertension)     Allergies   Allergies  Allergen Reactions   Ace Inhibitors     Other reaction(s): Unknown   Alendronate     Other reaction(s): Unknown   Aspirin     Other reaction(s): Unknown   Cefuroxime Axetil     Other reaction(s): Unknown   Celecoxib     Other reaction(s): Unknown   Ciprofloxacin     Other reaction(s):  Unknown   Cromolyn     Other reaction(s): Unknown   Estrogens     Other reaction(s): Unknown   Ibuprofen     Other reaction(s): Unknown   Naproxen Sodium Hives   Sulfamethoxazole-Trimethoprim     Other reaction(s): Unknown    Medications   (Not in a hospital admission)     Current Facility-Administered Medications:    [START ON 05/05/2022]  stroke: early stages of recovery book, , Does not apply, Once, Lorretta HarpNiu, Xilin, MD   acetaminophen (TYLENOL) tablet 650 mg, 650 mg, Oral, Q4H PRN **OR** acetaminophen (TYLENOL) 160 MG/5ML solution 650 mg, 650 mg, Per Tube, Q4H PRN **OR** acetaminophen (TYLENOL) suppository 650 mg, 650 mg, Rectal, Q4H PRN, Lorretta HarpNiu, Xilin, MD   clopidogrel (PLAVIX) tablet 75 mg, 75 mg, Oral, Daily, Bing NeighborsStack, Maurina Fawaz M, MD   enoxaparin (LOVENOX) injection 30 mg, 30 mg, Subcutaneous, Q24H, Lorretta HarpNiu, Xilin, MD   senna-docusate (Senokot-S) tablet 1 tablet, 1 tablet, Oral, QHS PRN, Lorretta HarpNiu, Xilin, MD  Current Outpatient Medications:    albuterol (VENTOLIN HFA) 108 (90 Base) MCG/ACT inhaler, Inhale 2 puffs into the lungs every 6 (six) hours as needed., Disp: , Rfl:    Cholecalciferol (D 1000) 25 MCG (1000 UT) capsule, Take 2,000 Units by mouth daily., Disp: , Rfl:    cyanocobalamin (VITAMIN B12) 1000 MCG tablet, Take 1,000 mcg by mouth daily., Disp: , Rfl:    fenofibrate 160 MG tablet, Take 160 mg by mouth daily., Disp: , Rfl:    fluticasone (FLONASE) 50 MCG/ACT nasal spray, Place 2 sprays into both nostrils daily as needed., Disp: , Rfl:    losartan (COZAAR) 50 MG tablet, Take 1 tablet by mouth daily., Disp: , Rfl:    montelukast (SINGULAIR) 10 MG tablet, Take 10 mg by mouth at bedtime., Disp: , Rfl:    raloxifene (EVISTA) 60 MG tablet, Take 60 mg by mouth daily., Disp: , Rfl:    torsemide (DEMADEX) 5 MG tablet, Take 15 mg by mouth daily., Disp: , Rfl:    traMADol (ULTRAM) 50 MG tablet, Take 50 mg by mouth 3 (three) times daily as needed., Disp: , Rfl:    COVID-19 mRNA bivalent vaccine,  Pfizer, (PFIZER COVID-19 VAC BIVALENT) injection, Inject into the muscle., Disp: 0.3 mL, Rfl: 0   COVID-19 mRNA Vac-TriS, Pfizer, (PFIZER-BIONT COVID-19 VAC-TRIS) SUSP injection, Inject into the muscle., Disp: 0.3 mL, Rfl: 0  Vitals   Vitals:   05/04/22 1230 05/04/22 1233 05/04/22 1300 05/04/22 1330  BP: (!) 141/81  (!) 141/67 137/64  Pulse: 83 (!) 123 90 86  Resp: (!) 22  14 16   Temp:      TempSrc:      SpO2: 99%  97% 97%  Weight:      Height:         Body mass index is 24.29 kg/m.  Physical Exam   Physical Exam Gen: A&O x4, NAD HEENT: Atraumatic, normocephalic;mucous membranes moist; oropharynx clear, tongue without atrophy or fasciculations. Neck: Supple, trachea midline. Resp: CTAB, no w/r/r CV: RRR, no m/g/r; nml S1 and S2. 2+ symmetric  peripheral pulses. Abd: soft/NT/ND; nabs x 4 quad Extrem: Nml bulk; no cyanosis, clubbing. BLE edema  Neuro: *MS: A&O x4. Follows multi-step commands.  *Speech: fluid, nondysarthric, able to name and repeat *CN:    I: Deferred   II,III: PERRLA, L upper quadrantopia otherwise VFF by confrontation, optic discs unable to be visualized 2/2 pupillary constriction   III,IV,VI: EOMI w/o nystagmus, no ptosis   V: Sensation intact from V1 to V3 to LT   VII: Eyelid closure was full.  R UMN facial droop   VIII: Extremely hard of hearing   IX,X: Voice normal, palate elevates symmetrically    XI: SCM/trap 5/5 bilat   XII: Tongue protrudes midline, no atrophy or fasciculations  *Motor:   Normal bulk. Drift BUE to bed, slightly weaker in RUE than LUE. Mild athetosis BUE when lifted antigravity, family says is not new but is more pronounced than usual. BLE drift but not to bed, symmetric. *Sensory: SILT. No double-simultaneous extinction.  *Coordination: UTA to adequately assess FNF 2/2 weakness *Reflexes:  2+ and symmetric throughout without clonus; toes down-going bilat *Gait: deferred  NIHSS  1a Level of Conscious.: 0 1b LOC Questions:  0 1c LOC Commands: 0 2 Best Gaze: 0 3 Visual: 1 4 Facial Palsy: 1 5a Motor Arm - left: 2 5b Motor Arm - Right: 2 6a Motor Leg - Left: 1 6b Motor Leg - Right: 1 7 Limb Ataxia: 0 8 Sensory: 0 9 Best Language: 0 10 Dysarthria: 0 11 Extinct. and Inatten.: 0  TOTAL: 8   Premorbid mRS = 3   Labs   CBC:  Recent Labs  Lab 05/04/22 0956  WBC 9.3  NEUTROABS 6.5  HGB 12.1  HCT 37.7  MCV 92.2  PLT 398    Basic Metabolic Panel:  Lab Results  Component Value Date   NA 142 05/04/2022   K 4.0 05/04/2022   CO2 26 05/04/2022   GLUCOSE 96 05/04/2022   BUN 29 (H) 05/04/2022   CREATININE 0.92 05/04/2022   CALCIUM 9.8 05/04/2022   GFRNONAA 58 (L) 05/04/2022   Lipid Panel: No results found for: "LDLCALC" HgbA1c: No results found for: "HGBA1C" Urine Drug Screen: No results found for: "LABOPIA", "COCAINSCRNUR", "LABBENZ", "AMPHETMU", "THCU", "LABBARB"  Alcohol Level     Component Value Date/Time   ETH <10 05/04/2022 1350     Impression   This is a 88 year old woman with past medical history significant for hypertension, hyperlipidemia, COPD, and bilateral leg edema who presents after episode of right-sided weakness and numbness with aphasia this morning. Her sx have improved (aphasia resolved) but she still has significant deficits on examination above baseline incl BUE>BLE weakness (more pronounced on R) and R facial droop. She also has L quadrantopia of unclear chronicity. Therefore I am concerned that she had acute infarct(s) rather than TIA. Last known well is therefore unclear.  Patient is not eligible for TNK as a result.  She has no prior history of A-fib but is noted to be in atrial fibrillation today in the ED.  She is not on anticoagulation or antiplatelets at home.  Patient will ultimately need to be anticoagulated for A-fib will defer in the setting of suspected acute ischemic stroke. Unfortunately she is unable to undergo MRI therefore will obtain CT head again at 24  hrs to eval for possible evolving ischemia.  Recommendations   - Admit for stroke workup - Permissive HTN x48 hrs from sx onset goal BP <220/110. PRN labetalol or hydralazine if BP above  these parameters. Avoid oral antihypertensives. - Unable to obtain MRI 2/2 prior stapedectomy 50 yrs ago with prosthesis of unknown materials and no available records. Repeat CT head at 24 hrs - Patient will ultimately need to be anticoagulated for a fib but given her persistent deficits I am concerned that she has acute infarct(s). Hold AC for now  - Pt has allergy to ASA, start plavix 75mg  daily. Plavix should be discontinued when patient is started on anticoagulation.  - TTE  - Check A1c and LDL + add statin per guidelines - q4 hr neuro checks - STAT head CT for any change in neuro exam - Tele - PT/OT/SLP - Stroke education - Amb referral to neurology upon discharge - Will continue to follow  Addendum 1939: Patient developed facial flushing after administration of plavix. Will hold for now. Patient allergic to ASA as well. If her head CT tomorrow @ 24 hrs is unchanged and exam improved we will likely start anticoagulation for a fib at that point (CHADS2-VASc is 6).  ______________________________________________________________________   Thank you for the opportunity to take part in the care of this patient. If you have any further questions, please contact the neurology consultation attending.  Signed,  , MD [facility] [phone]  If 7pm- 7am, please page neurology on call as listed in AMION.  **Any copied and pasted documentation in this note was written by me in another application not billed for and pasted by me into this document.

## 2022-05-04 NOTE — ED Notes (Signed)
Per Dr. Modesto Charon this is NOT a CODE stroke but requested this patient's CT be priority.   Called CT.  IV established, monitors in place. EKG done. Awaiting scan.

## 2022-05-04 NOTE — Evaluation (Signed)
Occupational Therapy Evaluation Patient Details Name: Kelly Hawkins MRN: [id number] DOB: [birth year] Today's Date: 05/04/2022   History of Present Illness Pt admitted to [facility] on 05/04/22 for c/o stroke like symptoms including: RUE weakness, facial droop, and word finding difficulties. Symptoms resolved by the time she came to the ED. Per Dr. Jacelyn Grip this is not a code stroke, but requested CT imaging. Significant PMH includes: COPD, HTN, and chronic pain.   Clinical Impression   Kelly Hawkins was seen for OT evaluation this date. Prior to hospital admission, pt was MOD I for ADL management and required assistance from family members for community mobility and IADL management. Pt lives with her daughter and grandson in a 1 level home. Pt presents to acute OT demonstrating impaired ADL performance and functional mobility 2/2 generalized weakness, decreased activity tolerance and decreased safety awareness (See OT problem list). Pt currently requires MOD A for LB bathing and dressing, MIN A for STS attempts from the commode, MIN GUARD for functional mobility.  Pt would benefit from skilled OT services to address noted impairments and functional limitations (see below for any additional details) in order to maximize safety and independence while minimizing falls risk and caregiver burden. Upon hospital discharge, recommend HHOT to maximize pt safety and return to functional independence during meaningful occupations of daily life.       Recommendations for follow up therapy are one component of a multi-disciplinary discharge planning process, led by the attending physician.  Recommendations may be updated based on patient status, additional functional criteria and insurance authorization.   Follow Up Recommendations  Home health OT     Assistance Recommended at Discharge Intermittent Supervision/Assistance  Patient can return home with the following A little help with walking and/or transfers;A little help with  bathing/dressing/bathroom;Assistance with cooking/housework;Assist for transportation;Help with stairs or ramp for entrance    Functional Status Assessment  Patient has had a recent decline in their functional status and demonstrates the ability to make significant improvements in function in a reasonable and predictable amount of time.  Equipment Recommendations  None recommended by OT (Pt has necessary equipment)    Recommendations for Other Services       Precautions / Restrictions Precautions Precautions: Fall Restrictions Other Position/Activity Restrictions: HOH, O2 >/= 94%      Mobility Bed Mobility Overal bed mobility: Needs Assistance Bed Mobility: Sit to Supine       Sit to supine: Min assist   General bed mobility comments: MIN A to bring BLE over EOB.    Transfers Overall transfer level: Needs assistance Equipment used: Rolling walker (2 wheels) Transfers: Sit to/from Stand Sit to Stand: Min assist           General transfer comment: MIN A for initial lift off from commode, MIN Guard for functional transfers in room.      Balance Overall balance assessment: Needs assistance Sitting-balance support: Bilateral upper extremity supported Sitting balance-Leahy Scale: Good     Standing balance support: During functional activity, No upper extremity supported Standing balance-Leahy Scale: Good Standing balance comment: steady static standing at sink to wash hands after toileting.                           ADL either performed or assessed with clinical judgement   ADL Overall ADL's : Needs assistance/impaired  General ADL Comments: MIN A for LB clothing management after toileting. CGA for functional mobility and toilet transfer with VCs for safe use of RW during functional mobility. Anticipate MIN-MOD A for more exertional ADL management such as LB bathing or dressing from STS.      Vision Patient Visual Report: No change from baseline       Perception     Praxis      Pertinent Vitals/Pain Pain Assessment Pain Assessment: No/denies pain     Hand Dominance Right   Extremity/Trunk Assessment Upper Extremity Assessment Upper Extremity Assessment: Generalized weakness (Chronic shoulder pain/AROM no focal weakness appreciated. Sensation intact t/o.)   Lower Extremity Assessment Lower Extremity Assessment: Generalized weakness;Defer to PT evaluation RLE Deficits / Details: hip flexion AROM limited secondary to weakness; otherwise grossly WFL. hip flexion 3+/5, knee ext 3+/5, knee flex 4/5, DF 4+/5 RLE Sensation: WNL RLE Coordination: WNL   Cervical / Trunk Assessment Cervical / Trunk Assessment: Kyphotic   Communication Communication Communication: No difficulties;HOH   Cognition Arousal/Alertness: Awake/alert Behavior During Therapy: WFL for tasks assessed/performed Overall Cognitive Status: Within Functional Limits for tasks assessed                                 General Comments: HOH at baseline. A&Ox4 and able to follow VCs consistently during session. Family at bedside state pt is at baseline cognition.     General Comments  VSS t/o session. Pt denies dizziness or lightheadedness. Per room monitors HR in low 100's after toileting/transfer.     Exercises Other Exercises Other Exercises: OT facilitated toileting, standing grooming, and functional transfers in room. Pt/family educated on role of OT in acute setting, DC recs, and routines modifications to support safety and functional independence during ADL management.   Shoulder Instructions      Home Living Family/patient expects to be discharged to:: Private residence Living Arrangements: Children (daughter and adult grandson) Available Help at Discharge: Family;Available 24 hours/day (daughter/grandson and 2 son's (live nearby)) Type of Home: House Home Access: Ramped  entrance     Home Layout: One level     Bathroom Shower/Tub: Tub/shower unit;Walk-in shower;Sponge bathes at baseline   Bathroom Toilet: Handicapped height (also has BSC over standard toilet.) Bathroom Accessibility: Yes How Accessible: Accessible via walker Home Equipment: Rolling Walker (2 wheels);Cane - single point;Wheelchair - manual;BSC/3in1          Prior Functioning/Environment Prior Level of Function : Needs assist             Mobility Comments: Has been requiring assist from family members for bed mobility; is normally mod I with RW for transfers and limited ambulation. Assist for WC mobility on ramp when entering/exiting home. ADLs Comments: Sponge baths at baseline; daughter to assist with some UB dressing due to limited shoulder AROM. Otherwise mod I with ADL's and feeding (needs food cut for her). Assist for IADL's, and medication set up.        OT Problem List: Decreased strength;Decreased range of motion;Decreased activity tolerance;Decreased safety awareness;Impaired balance (sitting and/or standing);Decreased knowledge of use of DME or AE      OT Treatment/Interventions: Self-care/ADL training;Therapeutic exercise;Therapeutic activities;DME and/or AE instruction;Patient/family education;Balance training    OT Goals(Current goals can be found in the care plan section) Acute Rehab OT Goals Patient Stated Goal: To go home OT Goal Formulation: With patient Time For Goal Achievement: 05/18/22 Potential to Achieve Goals: Good ADL Goals  Pt Will Perform Grooming: sitting;with modified independence Pt Will Perform Lower Body Dressing: sit to/from stand;with set-up;with supervision;with adaptive equipment;with caregiver independent in assisting Pt Will Transfer to Toilet: regular height toilet;with set-up;with supervision;ambulating Pt Will Perform Toileting - Clothing Manipulation and hygiene: sit to/from stand;with supervision;with set-up;with caregiver  independent in assisting  OT Frequency: Min 2X/week    Co-evaluation              AM-PAC OT "6 Clicks" Daily Activity     Outcome Measure Help from another person eating meals?: A Little Help from another person taking care of personal grooming?: A Little Help from another person toileting, which includes using toliet, bedpan, or urinal?: A Little Help from another person bathing (including washing, rinsing, drying)?: A Lot Help from another person to put on and taking off regular upper body clothing?: A Little Help from another person to put on and taking off regular lower body clothing?: A Lot 6 Click Score: 16   End of Session Equipment Utilized During Treatment: Gait belt;Rolling walker (2 wheels) Nurse Communication: Mobility status  Activity Tolerance: Patient tolerated treatment well Patient left: in bed;with call bell/phone within reach;with family/visitor present (with SLP in room)  OT Visit Diagnosis: Other abnormalities of gait and mobility (R26.89);Muscle weakness (generalized) (M62.81);Other symptoms and signs involving the nervous system [id number])                Time: [id number] OT Time Calculation (min): 14 min Charges:  OT General Charges $OT Visit: 1 Visit OT Evaluation $OT Eval Moderate Complexity: 1 Mod  Shara Blazing, M.S., OTR/L 05/04/22, 3:34 PM

## 2022-05-04 NOTE — H&P (Addendum)
History and Physical    CORLETTE CIANO GGY:[id number] DOB: 1926-09-06 DOA: 05/04/2022  Referring MD/NP/PA:   PCP: Pcp, No   Patient coming from:  The patient is coming from home.  At baseline, pt is independent for most of ADL.        Chief Complaint: right sided weakness, slurred speech, right facial droop  HPI: Kelly Hawkins is a 88 y.o. female with medical history significant of hypertension, hyperlipidemia, COPD, myelitis, bilateral leg edema, who presents with right-sided weakness, slurred speech and right facial droop due to possible stroke.   Per her daughter and son at the bedside, patient woke up normal at about 5:30, but at about 830, patient was noted to have right facial droop, right-sided weakness and slurred speech.  No vision loss or hearing loss.  Patient has mild SOB, no cough, chest pain, fever or chills.  No nausea vomiting, diarrhea or abdominal pain.  No symptoms of UTI. Patient is found to have new onset atrial fibrillation.  Heart rate 90-100 in ED.   Of note, per Carico of MRI,  "Pt had a stapedectomy with prosthesis done 50 years ago. We are unable to get any records on the type of implant she has. So per radiologist Dr. Renette Butters we can not safely do her MRI scan".  Data reviewed independently and ED Course: pt was found to have WBC 9.3, INR 1.1, creatinine 0.92, BUN 29, normal liver function, temperature normal, blood pressure 154/95, heart rate 90, RR 21, oxygen saturation 100% on room air. CT-head showed tiny age-indeterminate lacunar infarct versus prominent perivascular space within the anterior limb of left internal capsule. CTA negative for LVO. Patient is placed on telemetry bed for patient, Dr. Selina Cooley of neurology is consulted.   CT head:  1. No evidence of acute intracranial hemorrhage, acute cortical infarct or intracranial mass. 2. Tiny age-indeterminate lacunar infarct versus prominent perivascular space within the anterior limb of left internal capsule. A  brain MRI may be obtained for further evaluation, as clinically warranted. 3. Small chronic cortically-based infarct within the lateral right occipital lobe (PCA territory). 4. Mild chronic small vessel ischemic changes within the cerebral white matter. 5. Mild generalized cerebral atrophy. 6. Paranasal sinus disease, as described.   CTA neck:  1. The origin of the left common carotid artery is excluded from the field of view. Within this limitation, the common carotid and internal carotid arteries are patent within the neck without stenosis. Mild atherosclerotic plaque, bilaterally. 2. The vertebral arteries are patent within the neck without stenosis. Minimal non-stenotic atherosclerotic plaque within the left V2 segment. 3. Please note, the origin of the innominate artery is also excluded from the field of view. 4. Aortic Atherosclerosis (ICD10-I70.0). 5. Cervical spondylosis, as described.   CTA head:  1. No intracranial large vessel occlusion. 2. Intracranial atherosclerotic disease, as described. Most notably, there is a moderate focal stenosis within the right PCA P2 segment.  EKG: I have personally reviewed.  Atrial fibrillation, QTc 456, poor R wave progression, bifascicular block   Review of Systems:   General: no fevers, chills, no body weight gain, has fatigue HEENT: no blurry vision, hearing changes or sore throat Respiratory: no dyspnea, coughing, wheezing CV: no chest pain, no palpitations GI: no nausea, vomiting, abdominal pain, diarrhea, constipation GU: no dysuria, burning on urination, increased urinary frequency, hematuria  Ext: has leg edema Neuro: no vision change or hearing loss.  Has right-sided weakness, right facial droop, slurred speech Skin: no rash, no  skin tear. MSK: No muscle spasm, no deformity, no limitation of range of movement in spin Heme: No easy bruising.  Travel history: No recent long distant travel.   Allergy:  Allergies   Allergen Reactions   Ace Inhibitors     Other reaction(s): Unknown   Alendronate     Other reaction(s): Unknown   Aspirin     Other reaction(s): Unknown   Cefuroxime Axetil     Other reaction(s): Unknown   Celecoxib     Other reaction(s): Unknown   Ciprofloxacin     Other reaction(s): Unknown   Cromolyn     Other reaction(s): Unknown   Estrogens     Other reaction(s): Unknown   Ibuprofen     Other reaction(s): Unknown   Naproxen Sodium Hives   Sulfamethoxazole-Trimethoprim     Other reaction(s): Unknown    Past Medical History:  Diagnosis Date   Bilateral leg edema    COPD (chronic obstructive pulmonary disease) (HCC)    HLD (hyperlipidemia)    HTN (hypertension)     Past Surgical History:  Procedure Laterality Date   HERNIA REPAIR     STAPEDECTOMY     done 50+ years ago with prosthesis. MRI unsafe due to no records per radiologist    Social History:  reports that she has never smoked. She has never used smokeless tobacco. She reports that she does not currently use alcohol. She reports that she does not use drugs.  Family History:  Family History  Problem Relation Age of Onset   Diabetes Mother      Prior to Admission medications   Medication Sig Start Date End Date Taking? Authorizing Provider  albuterol (VENTOLIN HFA) 108 (90 Base) MCG/ACT inhaler Inhale 2 puffs into the lungs every 6 (six) hours as needed. 11/19/21  Yes [provider]  fenofibrate 160 MG tablet Take 160 mg by mouth daily. 04/07/22  Yes [provider]  fluticasone (FLONASE) 50 MCG/ACT nasal spray Place 2 sprays into both nostrils daily as needed. 01/20/22  Yes [provider]  losartan (COZAAR) 50 MG tablet Take 1 tablet by mouth daily. 04/14/21  Yes [provider]  montelukast (SINGULAIR) 10 MG tablet Take 10 mg by mouth at bedtime. 04/22/22  Yes [provider]  raloxifene (EVISTA) 60 MG tablet Take 60 mg by mouth daily. 01/25/22  Yes [provider]  torsemide (DEMADEX) 5 MG tablet Take 15 mg by mouth daily. 05/25/21 05/25/22 Yes [provider]  traMADol (ULTRAM) 50 MG tablet Take 50 mg by mouth 3 (three) times daily as needed. 04/27/22  Yes [provider]  Cholecalciferol (D 1000) 25 MCG (1000 UT) capsule Take 2,000 Units by mouth daily.    [provider]  COVID-19 mRNA bivalent vaccine, Pfizer, (PFIZER COVID-19 VAC BIVALENT) injection Inject into the muscle. 04/07/21   Judyann Munson, MD  COVID-19 mRNA Vac-TriS, Pfizer, (PFIZER-BIONT COVID-19 VAC-TRIS) SUSP injection Inject into the muscle. 12/05/20   Judyann Munson, MD  cyanocobalamin (VITAMIN B12) 1000 MCG tablet Take 1,000 mcg by mouth daily.    [provider]    Physical Exam: Vitals:   05/04/22 1330 05/04/22 1400 05/04/22 1430 05/04/22 1521  BP: 137/64 137/78 (!) 161/77 (!) 158/75  Pulse: 86 91 77   Resp: Temp:    98 F (36.7 C)  TempSrc:    Oral  SpO2: 97% 97% 98% 98%  Weight:      Height:  General: Not in acute distress HEENT:       Eyes: PERRL, EOMI, no scleral icterus.       ENT: No discharge from the ears and nose, no pharynx injection, no tonsillar enlargement.        Neck: No JVD, no bruit, no mass felt. Heme: No neck lymph node enlargement. Cardiac: S1/S2, RRR,  No gallops or rubs. Respiratory: No rales, wheezing, rhonchi or rubs. GI: Soft, nondistended, nontender, no rebound pain, no organomegaly, BS present. GU: No hematuria Ext: 1+ pitting leg edema bilaterally. 1+DP/PT pulse bilaterally. Musculoskeletal: No joint deformities, No joint redness or warmth, no limitation of ROM in spin. Skin: No rashes.  Neuro: Alert, oriented X3, cranial nerves II-XII grossly intact, muscle strength is slightly weaker in right arm, sensation to light touch intact.  Psych: Patient is not psychotic, no suicidal or hemocidal ideation.  Labs on Admission: I have personally reviewed following labs and  imaging studies  CBC: Recent Labs  Lab 05/04/22 0956  WBC 9.3  NEUTROABS 6.5  HGB 12.1  HCT 37.7  MCV 92.2  PLT 398   Basic Metabolic Panel: Recent Labs  Lab 05/04/22 0956  NA 142  K 4.0  CL 108  CO2 26  GLUCOSE 96  BUN 29*  CREATININE 0.92  CALCIUM 9.8   GFR: Estimated Creatinine Clearance: 24.9 mL/min (by C-G formula based on SCr of 0.92 mg/dL). Liver Function Tests: Recent Labs  Lab 05/04/22 0956  AST 27  ALT 14  ALKPHOS 35*  BILITOT 1.1  PROT 6.8  ALBUMIN 3.7   No results for input(s): "LIPASE", "AMYLASE" in the last 168 hours. No results for input(s): "AMMONIA" in the last 168 hours. Coagulation Profile: Recent Labs  Lab 05/04/22 0956  INR 1.1   Cardiac Enzymes: No results for input(s): "CKTOTAL", "CKMB", "CKMBINDEX", "TROPONINI" in the last 168 hours. BNP (last 3 results) No results for input(s): "PROBNP" in the last 8760 hours. HbA1C: No results for input(s): "HGBA1C" in the last 72 hours. CBG: Recent Labs  Lab 05/04/22 0946  GLUCAP 88   Lipid Profile: No results for input(s): "CHOL", "HDL", "LDLCALC", "TRIG", "CHOLHDL", "LDLDIRECT" in the last 72 hours. Thyroid Function Tests: No results for input(s): "TSH", "T4TOTAL", "FREET4", "T3FREE", "THYROIDAB" in the last 72 hours. Anemia Panel: No results for input(s): "VITAMINB12", "FOLATE", "FERRITIN", "TIBC", "IRON", "RETICCTPCT" in the last 72 hours. Urine analysis: No results found for: "COLORURINE", "APPEARANCEUR", "LABSPEC", "PHURINE", "GLUCOSEU", "HGBUR", "BILIRUBINUR", "KETONESUR", "PROTEINUR", "UROBILINOGEN", "NITRITE", "LEUKOCYTESUR" Sepsis Labs: @LABRCNTIP (procalcitonin:4,lacticidven:4) )No results found for this or any previous visit (from the past 240 hour(s)).   Radiological Exams on Admission: Venous Img Lower Bilateral (DVT)  Result Date: 05/04/2022 CLINICAL DATA:  Leg edema in a 88 year old female. EXAM: Bilateral LOWER EXTREMITY VENOUS DOPPLER ULTRASOUND TECHNIQUE:  Gray-scale sonography with compression, as well as color and duplex ultrasound, were performed to evaluate the deep venous system(s) from the level of the common femoral vein through the popliteal and proximal calf veins. COMPARISON:  None available FINDINGS: VENOUS Normal compressibility of the common femoral, superficial femoral, and popliteal veins, as well as the visualized calf veins. Visualized portions of profunda femoral vein and great saphenous vein unremarkable. No filling defects to suggest DVT on grayscale or color Doppler imaging. Doppler waveforms show normal direction of venous flow, normal respiratory plasticity and response to augmentation. OTHER 4.3 x 1.4 x 2.6 cm Baker's cyst in the LEFT popliteal fossa. Signs of edema in the lower leg in particular in the subcutaneous tissues. This  may be slightly greater on the LEFT than the RIGHT. Limitations: None. IMPRESSION: Negative for deep venous thrombosis in the LEFT or RIGHT lower extremity with signs of lower extremity edema and LEFT popliteal Baker's cyst. Electronically Signed   By: Donzetta Kohut M.D.   On: 05/04/2022 13:27   CT ANGIO HEAD NECK W WO CM  Result Date: 05/04/2022 CLINICAL DATA:  Provided history: Neuro deficit, acute, stroke suspected. Additional history provided: Right facial droop, leaning to the right, reported symptoms resolved upon EMS arrival. EXAM: CT ANGIOGRAPHY HEAD AND NECK TECHNIQUE: Multidetector CT imaging of the head and neck was performed using the standard protocol during bolus administration of intravenous contrast. Multiplanar CT image reconstructions and MIPs were obtained to evaluate the vascular anatomy. Carotid stenosis measurements (when applicable) are obtained utilizing NASCET criteria, using the distal internal carotid diameter as the denominator. RADIATION DOSE REDUCTION: This exam was performed according to the departmental dose-optimization program which includes automated exposure control, adjustment  of the mA and/or kV according to patient size and/or use of iterative reconstruction technique. CONTRAST:  34mL OMNIPAQUE IOHEXOL 350 MG/ML SOLN COMPARISON:  No pertinent prior exams available for comparison. FINDINGS: CT HEAD FINDINGS Brain: Mild generalized cerebral atrophy. Small chronic appearing cortical/subcortical infarct within the right occipital lobe (right PCA territory) (for instance as seen on series 3, image 14). Mild patchy and ill-defined hypoattenuation within the cerebral white matter. There is a small hypodense focus within the anterior limb of left internal capsule, which may reflect a prominent perivascular space or tiny age-indeterminate lacunar infarct (best appreciated on series 6, image 23). There is no acute intracranial hemorrhage. No demarcated cortical infarct. No extra-axial fluid collection. No evidence of an intracranial mass. No midline shift. Vascular: No hyperdense vessel.  Atherosclerotic calcifications. Skull: No fracture or aggressive osseous lesion. Sinuses/Orbits: No mass or acute finding within the imaged orbits. Complete opacification of the right maxillary sinus. Mild mucosal thickening scattered within the mild mucosal thickening, and possible fluid, scattered within the bilateral ethmoid air cells. Review of the MIP images confirms the above findings CTA NECK FINDINGS Aortic arch: The origins of the innominate and left common carotid arteries are excluded from the field of view. Atherosclerotic plaque within the visualized aortic arch and proximal major branch vessels of the neck. Within described limitations, there is no appreciable hemodynamically significant stenosis within the innominate or proximal subclavian arteries. Right carotid system: CCA and ICA patent within the neck without stenosis. Mild atherosclerotic plaque within the CCA and cervical ICA. Left carotid system: The origin of the common carotid artery is excluded from the field of view. Within this  limitation, the CCA and ICA are patent within the neck without stenosis. Mild atherosclerotic plaque within the CCA and carotid bifurcation. Vertebral arteries: Codominant and patent within the neck without stenosis. Minimal nonstenotic atherosclerotic plaque within the left V2 segment. Skeleton: Reversal of the expected cervical lordosis. Slight C4-C5 and C5-C6 grade 1 anterolisthesis. Cervical spondylosis. Most notably, advanced disc space narrowing is present at C5-C6, C6-C7 and C7-T1. Facet joint ankylosis on the left at C3-C4. Other neck: No neck mass or cervical lymphadenopathy. Upper chest: No consolidation within the imaged lung apices. Review of the MIP images confirms the above findings CTA HEAD FINDINGS Anterior circulation: The intracranial internal carotid arteries are patent. Atherosclerotic plaque within both vessels with with no more than mild stenosis. The M1 middle cerebral arteries are patent. Atherosclerotic irregularity of the M2 and more distal MCA vessels, bilaterally. No M2 proximal branch  occlusion or high-grade proximal stenosis. The anterior cerebral arteries are patent. No intracranial aneurysm is identified. Posterior circulation: The intracranial vertebral arteries are patent. Nonstenotic atherosclerotic plaque within both vessels. The basilar artery is patent. The posterior cerebral arteries are patent. Atherosclerotic irregularity of the right PCA. Most notably, there is a moderate focal stenosis within the proximal right P2 segment (series 11, image 22) (series 15, image 13). A small left posterior communicating artery is present. The right posterior communicating artery is diminutive or absent. Venous sinuses: Within the limitations of contrast timing, no convincing thrombus. Anatomic variants: As described. Review of the MIP images confirms the above findings IMPRESSION: CT head: 1. No evidence of acute intracranial hemorrhage, acute cortical infarct or intracranial mass. 2. Tiny  age-indeterminate lacunar infarct versus prominent perivascular space within the anterior limb of left internal capsule. A brain MRI may be obtained for further evaluation, as clinically warranted. 3. Small chronic cortically-based infarct within the lateral right occipital lobe (PCA territory). 4. Mild chronic small vessel ischemic changes within the cerebral white matter. 5. Mild generalized cerebral atrophy. 6. Paranasal sinus disease, as described. CTA neck: 1. The origin of the left common carotid artery is excluded from the field of view. Within this limitation, the common carotid and internal carotid arteries are patent within the neck without stenosis. Mild atherosclerotic plaque, bilaterally. 2. The vertebral arteries are patent within the neck without stenosis. Minimal non-stenotic atherosclerotic plaque within the left V2 segment. 3. Please note, the origin of the innominate artery is also excluded from the field of view. 4. Aortic Atherosclerosis (ICD10-I70.0). 5. Cervical spondylosis, as described. CTA head: 1. No intracranial large vessel occlusion. 2. Intracranial atherosclerotic disease, as described. Most notably, there is a moderate focal stenosis within the right PCA P2 segment. Electronically Signed   By: Jackey LogeKyle  Golden D.O.   On: 05/04/2022 10:50      Assessment/Plan Principal Problem:   Stroke [facility](HCC) Active Problems:   HTN (hypertension)   COPD (chronic obstructive pulmonary disease) (HCC)   New onset atrial fibrillation (HCC)   HLD (hyperlipidemia)   Bilateral leg edema   Assessment and Plan:  Stroke [facility](HCC): pt has possible stroke. Consulted Dr. Selina CooleyStack of neuro.   - Placed on tele bed for observation - can not do MRI of brain --> will repeat CT-head in 24 hours - pt is allergic to ASA --> will start plavix 75 mg daily - will start lipitor 40 mg daily. Pt is on fenofibrate - will hold oral Bp meds to allow permissive HTN - fasting lipid panel and HbA1c  - 2D transthoracic  echocardiography  - swallowing screen. If fails, will get SLP - PT/OT consult  Addendum: Patient developed facial flushing after giving Plavix.Will hold plavix per Dr. Selina CooleyStack. Will repeat CT-head tomorrow. If unchanged, may start anticoagulation   HTN (hypertension) -IV hydralazine as needed for SBP>220 or dBP>110 -Hold home torsemide  COPD (chronic obstructive pulmonary disease) (HCC): stable -Bronchodilators  New onset atrial fibrillation (HCC): HR is up to 123 --> 77 now.  Patient will need anticoagulant, but cannot give now in the setting of acute stroke due to concern of hemorrhagic conversion. -consulted Dr. Gwen PoundsKowalski of card -As needed IV metoprolol  2.5 mg q2h for heart rate > 125 -check TSH -f/u 2d echo  HLD (hyperlipidemia) -Lipitor and fenofibrate  Bilateral leg edema: Lower extremity venous Dopplers negative for DVT.  Patient is taking torsemide.  No history of congestive heart failure.  BNP 212.  No shortness breath. -  Hold torsemide in the setting of acute stroke -Follow-up 2D echo      DVT ppx:  SQ Lovenox  Code Status: DNR per her daughter and son  Family Communication:    Yes, patient's daughter and son at bed side.      Disposition Plan:  Anticipate discharge back to previous environment  Consults called:  Dr. Selina Cooley  Admission status and Level of care: Telemetry Medical:  for obs    Dispo: The patient is from: Home              Anticipated d/c is to: Home              Anticipated d/c date is: 1 day              Patient currently is not medically stable to d/c.    Severity of Illness:  The appropriate patient status for this patient is OBSERVATION. Observation status is judged to be reasonable and necessary in order to provide the required intensity of service to ensure the patient's safety. The patient's presenting symptoms, physical exam findings, and initial radiographic and laboratory data in the context of their medical condition is felt to place  them at decreased risk for further clinical deterioration. Furthermore, it is anticipated that the patient will be medically stable for discharge from the hospital within 2 midnights of admission.        Date of Service 05/04/2022    Lorretta Harp [facility]   If 7PM-7AM, please contact night-coverage [email or web address] 05/04/2022, 3:38 PM

## 2022-05-04 NOTE — ED Notes (Signed)
US at bedside

## 2022-05-04 NOTE — ED Notes (Signed)
Dr. Modesto Charon at bedside - states DO NOT ORDER imaging at this time.

## 2022-05-04 NOTE — Consult Note (Signed)
[facility] CONSULT [facility]       Patient ID: Kelly FrameWilter J Vanlanen MRN: [id number] DOB/AGE: 86/09/1926 86 y.o.  Admit date: 05/04/2022 Referring Physician Dr. Clyde LundborgNiu Primary Physician Dr. Einar CrowMarshall Anderson Primary Cardiologist none Reason for Consultation new onset AF   HPI: Rosaura J. Mack GuiseLoy is a 89yoF with a PMH of HTN, HLD, COPD, remote hx stapedectomy with prosthesis, chronic peripheral edema, who presented [facility] 05/04/2022 with right-sided weakness, aphasia and a right-sided facial droop, concerning for acute CVA.  She was found to be in atrial fibrillation on presentation, which appears to be new in onset for which cardiology is consulted for further assistance.  The patient presents with her son and daughter who contribute to the history. The patient lives with her daughter and was in her usual state of health until this morning she went to the bathroom with her rollator to get ready for the day usual.  Her daughter noticed that she was unable to speak and had significant right-sided weakness and a right-sided facial droop, was leaning against the door and is unable to stand without assistance.  They called EMS brought her to the emergency department out of concern for acute CVA.  Per EMS on route, the patient began to speak but was very dysarthric with difficulty with word finding.  Her speech has now returned to baseline, but had a persistent right-sided facial droop. The patient denied any chest discomfort, shortness of breath, palpitations or heart racing, or worsening of her chronic peripheral edema.  CTA head and neck was negative for LVO, but with her ongoing weakness/significant deficits, acute infarct was suspected by neurology.  The patient was found to be in atrial fibrillation on arrival to the ED which appears to be new in onset.  Patient denies ever being seen by a cardiologist in the past, no prior history of stress test or echocardiogram upon my chart review.  Recent vitals  are notable for a blood pressure of 157/78, she is in atrial fibrillation on telemetry with rate primarily in the 90s with short paroxysms to the 110s without any AV nodal blockers.  Other labs are notable for BUN/creatinine 29/2.92 and GFR 58, BNP is slightly elevated at 212.  Bilateral lower extremity ultrasound is negative for DVT, but did show a Baker's cyst in the left popliteal fossa.  Review of systems complete and found to be negative unless listed above     Past Medical History:  Diagnosis Date   Bilateral leg edema    COPD (chronic obstructive pulmonary disease) (HCC)    HLD (hyperlipidemia)    HTN (hypertension)     Past Surgical History:  Procedure Laterality Date   HERNIA REPAIR     STAPEDECTOMY     done 50+ years ago with prosthesis. MRI unsafe due to no records per radiologist    (Not in a hospital admission)  Social History   Socioeconomic History   Marital status: Widowed    Spouse name: Not on file   Number of children: Not on file   Years of education: Not on file   Highest education level: Not on file  Occupational History   Not on file  Tobacco Use   Smoking status: Never   Smokeless tobacco: Never  Substance and Sexual Activity   Alcohol use: Not Currently   Drug use: Never   Sexual activity: Not on file  Other Topics Concern   Not on file  Social History Narrative   ** Merged History  Encounter **       Social Determinants of Health   Financial Resource Strain: Not on file  Food Insecurity: Not on file  Transportation Needs: Not on file  Physical Activity: Not on file  Stress: Not on file  Social Connections: Not on file  Intimate Partner Violence: Not on file    Family History  Problem Relation Age of Onset   Diabetes Mother      Vitals:   05/04/22 1330 05/04/22 1400 05/04/22 1430 05/04/22 1521  BP: 137/64 137/78 (!) 161/77 (!) 158/75  Pulse: 86 91 77   Resp: Temp:    98 F (36.7 C)  TempSrc:    Oral  SpO2: 97% 97%  98% 98%  Weight:      Height:        PHYSICAL EXAM General: pleasant elderly thin female, in no acute distress. Sitting at incline in ED stretcher. Daughter and son at bedside HEENT:  Normocephalic and atraumatic. Hard of hearing Neck:  No JVD.  Lungs: Normal respiratory effort on room air. Trace bibasilar crackles Heart: irregularly irregular with controlled rate . Normal S1 and S2 without gallops or murmurs.  Abdomen: Non-distended appearing.  Msk: generalized weakness. Grip strength equal 4/5  Extremities: Warm and well perfused. No clubbing, cyanosis. Generalized  lowe extremity edema and erythema, LLE wrapped with ace bandage, slightly tender to palpation.  Neuro: Alert and oriented X 3. Speech is clear Psych:  Answers questions appropriately.   Labs: Basic Metabolic Panel: Recent Labs    05/04/22 0956  NA 142  K 4.0  CL 108  CO2 26  GLUCOSE 96  BUN 29*  CREATININE 0.92  CALCIUM 9.8   Liver Function Tests: Recent Labs    05/04/22 0956  AST 27  ALT 14  ALKPHOS 35*  BILITOT 1.1  PROT 6.8  ALBUMIN 3.7   No results for input(s): "LIPASE", "AMYLASE" in the last 72 hours. CBC: Recent Labs    05/04/22 0956  WBC 9.3  NEUTROABS 6.5  HGB 12.1  HCT 37.7  MCV 92.2  PLT 398   Cardiac Enzymes: No results for input(s): "CKTOTAL", "CKMB", "CKMBINDEX", "TROPONINIHS" in the last 72 hours. BNP: Recent Labs    05/04/22 1007  BNP 212.6*   D-Dimer: No results for input(s): "DDIMER" in the last 72 hours. Hemoglobin A1C: No results for input(s): "HGBA1C" in the last 72 hours. Fasting Lipid Panel: No results for input(s): "CHOL", "HDL", "LDLCALC", "TRIG", "CHOLHDL", "LDLDIRECT" in the last 72 hours. Thyroid Function Tests: No results for input(s): "TSH", "T4TOTAL", "T3FREE", "THYROIDAB" in the last 72 hours.  Invalid input(s): "FREET3" Anemia Panel: No results for input(s): "VITAMINB12", "FOLATE", "FERRITIN", "TIBC", "IRON", "RETICCTPCT" in the last 72 hours.    Radiology: US Venous Img Lower Bilateral (DVT)  Result Date: 05/04/2022 CLINICAL DATA:  Leg edema in a 88 year old female. EXAM: Bilateral LOWER EXTREMITY VENOUS DOPPLER ULTRASOUND TECHNIQUE: Gray-scale sonography with compression, as well as color and duplex ultrasound, were performed to evaluate the deep venous system(s) from the level of the common femoral vein through the popliteal and proximal calf veins. COMPARISON:  None available FINDINGS: VENOUS Normal compressibility of the common femoral, superficial femoral, and popliteal veins, as well as the visualized calf veins. Visualized portions of profunda femoral vein and great saphenous vein unremarkable. No filling defects to suggest DVT on grayscale or color Doppler imaging. Doppler waveforms show normal direction of venous flow, normal respiratory plasticity and response to augmentation. OTHER 4.3 x  1.4 x 2.6 cm Baker's cyst in the LEFT popliteal fossa. Signs of edema in the lower leg in particular in the subcutaneous tissues. This may be slightly greater on the LEFT than the RIGHT. Limitations: None. IMPRESSION: Negative for deep venous thrombosis in the LEFT or RIGHT lower extremity with signs of lower extremity edema and LEFT popliteal Baker's cyst. Electronically Signed   By: Donzetta Kohut M.D.   On: 05/04/2022 13:27   CT ANGIO HEAD NECK W WO CM  Result Date: 05/04/2022 CLINICAL DATA:  Provided history: Neuro deficit, acute, stroke suspected. Additional history provided: Right facial droop, leaning to the right, reported symptoms resolved upon EMS arrival. EXAM: CT ANGIOGRAPHY HEAD AND NECK TECHNIQUE: Multidetector CT imaging of the head and neck was performed using the standard protocol during bolus administration of intravenous contrast. Multiplanar CT image reconstructions and MIPs were obtained to evaluate the vascular anatomy. Carotid stenosis measurements (when applicable) are obtained utilizing NASCET criteria, using the distal internal  carotid diameter as the denominator. RADIATION DOSE REDUCTION: This exam was performed according to the departmental dose-optimization program which includes automated exposure control, adjustment of the mA and/or kV according to patient size and/or use of iterative reconstruction technique. CONTRAST:  43mL OMNIPAQUE IOHEXOL 350 MG/ML SOLN COMPARISON:  No pertinent prior exams available for comparison. FINDINGS: CT HEAD FINDINGS Brain: Mild generalized cerebral atrophy. Small chronic appearing cortical/subcortical infarct within the right occipital lobe (right PCA territory) (for instance as seen on series 3, image 14). Mild patchy and ill-defined hypoattenuation within the cerebral white matter. There is a small hypodense focus within the anterior limb of left internal capsule, which may reflect a prominent perivascular space or tiny age-indeterminate lacunar infarct (best appreciated on series 6, image 23). There is no acute intracranial hemorrhage. No demarcated cortical infarct. No extra-axial fluid collection. No evidence of an intracranial mass. No midline shift. Vascular: No hyperdense vessel.  Atherosclerotic calcifications. Skull: No fracture or aggressive osseous lesion. Sinuses/Orbits: No mass or acute finding within the imaged orbits. Complete opacification of the right maxillary sinus. Mild mucosal thickening scattered within the mild mucosal thickening, and possible fluid, scattered within the bilateral ethmoid air cells. Review of the MIP images confirms the above findings CTA NECK FINDINGS Aortic arch: The origins of the innominate and left common carotid arteries are excluded from the field of view. Atherosclerotic plaque within the visualized aortic arch and proximal major branch vessels of the neck. Within described limitations, there is no appreciable hemodynamically significant stenosis within the innominate or proximal subclavian arteries. Right carotid system: CCA and ICA patent within the  neck without stenosis. Mild atherosclerotic plaque within the CCA and cervical ICA. Left carotid system: The origin of the common carotid artery is excluded from the field of view. Within this limitation, the CCA and ICA are patent within the neck without stenosis. Mild atherosclerotic plaque within the CCA and carotid bifurcation. Vertebral arteries: Codominant and patent within the neck without stenosis. Minimal nonstenotic atherosclerotic plaque within the left V2 segment. Skeleton: Reversal of the expected cervical lordosis. Slight C4-C5 and C5-C6 grade 1 anterolisthesis. Cervical spondylosis. Most notably, advanced disc space narrowing is present at C5-C6, C6-C7 and C7-T1. Facet joint ankylosis on the left at C3-C4. Other neck: No neck mass or cervical lymphadenopathy. Upper chest: No consolidation within the imaged lung apices. Review of the MIP images confirms the above findings CTA HEAD FINDINGS Anterior circulation: The intracranial internal carotid arteries are patent. Atherosclerotic plaque within both vessels with with no more  than mild stenosis. The M1 middle cerebral arteries are patent. Atherosclerotic irregularity of the M2 and more distal MCA vessels, bilaterally. No M2 proximal branch occlusion or high-grade proximal stenosis. The anterior cerebral arteries are patent. No intracranial aneurysm is identified. Posterior circulation: The intracranial vertebral arteries are patent. Nonstenotic atherosclerotic plaque within both vessels. The basilar artery is patent. The posterior cerebral arteries are patent. Atherosclerotic irregularity of the right PCA. Most notably, there is a moderate focal stenosis within the proximal right P2 segment (series 11, image 22) (series 15, image 13). A small left posterior communicating artery is present. The right posterior communicating artery is diminutive or absent. Venous sinuses: Within the limitations of contrast timing, no convincing thrombus. Anatomic  variants: As described. Review of the MIP images confirms the above findings IMPRESSION: CT head: 1. No evidence of acute intracranial hemorrhage, acute cortical infarct or intracranial mass. 2. Tiny age-indeterminate lacunar infarct versus prominent perivascular space within the anterior limb of left internal capsule. A brain MRI may be obtained for further evaluation, as clinically warranted. 3. Small chronic cortically-based infarct within the lateral right occipital lobe (PCA territory). 4. Mild chronic small vessel ischemic changes within the cerebral white matter. 5. Mild generalized cerebral atrophy. 6. Paranasal sinus disease, as described. CTA neck: 1. The origin of the left common carotid artery is excluded from the field of view. Within this limitation, the common carotid and internal carotid arteries are patent within the neck without stenosis. Mild atherosclerotic plaque, bilaterally. 2. The vertebral arteries are patent within the neck without stenosis. Minimal non-stenotic atherosclerotic plaque within the left V2 segment. 3. Please note, the origin of the innominate artery is also excluded from the field of view. 4. Aortic Atherosclerosis (ICD10-I70.0). 5. Cervical spondylosis, as described. CTA head: 1. No intracranial large vessel occlusion. 2. Intracranial atherosclerotic disease, as described. Most notably, there is a moderate focal stenosis within the right PCA P2 segment. Electronically Signed   By: Jackey Loge D.O.   On: 05/04/2022 10:50    ECHO no prior available for review  TELEMETRY reviewed by me (LT) 05/04/2022 : AF rate 90s  EKG reviewed by me: atrial fibrillation rate 90  Data reviewed by me (LT) 05/04/2022: last pcp note, ed note, neurology ntoe, admission H&P, cbc bmp vitals tele cta head/neck bnp   Principal Problem:   Stroke [facility]) Active Problems:   HTN (hypertension)   HLD (hyperlipidemia)   COPD (chronic obstructive pulmonary disease) (HCC)   Bilateral leg edema    New onset atrial fibrillation (HCC)    ASSESSMENT AND PLAN:  Paysen J. Nida is a 89yoF with a PMH of HTN, HLD, COPD, remote hx stapedectomy with prosthesis, chronic peripheral edema, who presented to [facility] ED 05/04/2022 with right-sided weakness, aphasia and a right-sided facial droop, concerning for acute CVA.  She was found to be in atrial fibrillation on presentation, which appears to be new in onset for which cardiology is consulted for further assistance.  # Acute CVA Presented with aphasia, right-sided weakness, and right-sided facial droop CTA head and neck negative for LVO, MRI unfortunately unable to be obtained due to herhx of stapedectomy with unknown prosthesis.  Neurology planning to repeat head CT tomorrow morning 11/22 before initiation of anticoagulation. -continue on plavix 75mg  daily (allergic to ASA) until [facility] started per neurology  -continue atorvastatin 40mg  daily  # New onset paroxysmal atrial fibrillation Seen on initial EKG and on telemetry, rate controlled in the 90s while at rest without AV nodal blockers.  She is under permissive hypertension parameters, so we will hold off on BB scheduled and continue as needed metoprolol if her rate is uncontrolled.  She has a CHA2DS2-VASc score of 6 (age, htn, sex, cva) and meets criteria for dose reduced eliquis 2.5 mg BID based on her age >61 and weight <60kg.  -Hold off on starting Eliquis until after repeat head CT tomorrow morning, per neurology -Echocardiogram complete  -Likely start low-dose metoprolol once outside in permissive hypertension parameters  # Chronic peripheral edema # Elevated BNP BNP slightly elevated at 212, with peripheral edema and trace crackles on exam. Normally takes torsemide for peripheral edema. No baseline echo. Suspect diastolic dysfunction, echo for further eval.  -restart torsemide at discharge  This patient's plan of care was discussed and created with Dr. Gwen Pounds and he is in  agreement.  Signed: Rebeca Allegra , PA-C 05/04/2022, 4:07 PM [facility] Cardiology

## 2022-05-04 NOTE — ED Notes (Signed)
Grier Mitts, RN confirmed she is ready for patient - requesting transport now.

## 2022-05-05 ENCOUNTER — Observation Stay: Payer: Medicare Other

## 2022-05-05 DIAGNOSIS — Z79899 Other long term (current) drug therapy: Secondary | ICD-10-CM | POA: Diagnosis not present

## 2022-05-05 DIAGNOSIS — M7122 Synovial cyst of popliteal space [Baker], left knee: Secondary | ICD-10-CM | POA: Diagnosis present

## 2022-05-05 DIAGNOSIS — R531 Weakness: Secondary | ICD-10-CM

## 2022-05-05 DIAGNOSIS — J449 Chronic obstructive pulmonary disease, unspecified: Secondary | ICD-10-CM | POA: Diagnosis not present

## 2022-05-05 DIAGNOSIS — Z515 Encounter for palliative care: Secondary | ICD-10-CM | POA: Diagnosis not present

## 2022-05-05 DIAGNOSIS — H9190 Unspecified hearing loss, unspecified ear: Secondary | ICD-10-CM | POA: Diagnosis present

## 2022-05-05 DIAGNOSIS — R4701 Aphasia: Secondary | ICD-10-CM | POA: Diagnosis not present

## 2022-05-05 DIAGNOSIS — G0491 Myelitis, unspecified: Secondary | ICD-10-CM | POA: Diagnosis present

## 2022-05-05 DIAGNOSIS — E785 Hyperlipidemia, unspecified: Secondary | ICD-10-CM | POA: Diagnosis not present

## 2022-05-05 DIAGNOSIS — G459 Transient cerebral ischemic attack, unspecified: Secondary | ICD-10-CM | POA: Diagnosis present

## 2022-05-05 DIAGNOSIS — Z8673 Personal history of transient ischemic attack (TIA), and cerebral infarction without residual deficits: Secondary | ICD-10-CM | POA: Diagnosis not present

## 2022-05-05 DIAGNOSIS — I639 Cerebral infarction, unspecified: Secondary | ICD-10-CM | POA: Diagnosis not present

## 2022-05-05 DIAGNOSIS — Z7189 Other specified counseling: Secondary | ICD-10-CM | POA: Diagnosis not present

## 2022-05-05 DIAGNOSIS — Z886 Allergy status to analgesic agent status: Secondary | ICD-10-CM | POA: Diagnosis not present

## 2022-05-05 DIAGNOSIS — R29818 Other symptoms and signs involving the nervous system: Secondary | ICD-10-CM | POA: Diagnosis present

## 2022-05-05 DIAGNOSIS — R471 Dysarthria and anarthria: Secondary | ICD-10-CM | POA: Diagnosis not present

## 2022-05-05 DIAGNOSIS — R29708 NIHSS score 8: Secondary | ICD-10-CM | POA: Diagnosis present

## 2022-05-05 DIAGNOSIS — I480 Paroxysmal atrial fibrillation: Secondary | ICD-10-CM | POA: Diagnosis present

## 2022-05-05 DIAGNOSIS — Z833 Family history of diabetes mellitus: Secondary | ICD-10-CM | POA: Diagnosis not present

## 2022-05-05 DIAGNOSIS — I10 Essential (primary) hypertension: Secondary | ICD-10-CM | POA: Diagnosis not present

## 2022-05-05 DIAGNOSIS — R29810 Facial weakness: Secondary | ICD-10-CM | POA: Diagnosis present

## 2022-05-05 DIAGNOSIS — R600 Localized edema: Secondary | ICD-10-CM | POA: Diagnosis present

## 2022-05-05 DIAGNOSIS — I672 Cerebral atherosclerosis: Secondary | ICD-10-CM | POA: Diagnosis present

## 2022-05-05 DIAGNOSIS — I4891 Unspecified atrial fibrillation: Secondary | ICD-10-CM | POA: Diagnosis not present

## 2022-05-05 DIAGNOSIS — T45525A Adverse effect of antithrombotic drugs, initial encounter: Secondary | ICD-10-CM | POA: Diagnosis not present

## 2022-05-05 DIAGNOSIS — G8191 Hemiplegia, unspecified affecting right dominant side: Secondary | ICD-10-CM | POA: Diagnosis present

## 2022-05-05 DIAGNOSIS — R232 Flushing: Secondary | ICD-10-CM | POA: Diagnosis not present

## 2022-05-05 DIAGNOSIS — Z66 Do not resuscitate: Secondary | ICD-10-CM | POA: Diagnosis present

## 2022-05-05 LAB — ECHOCARDIOGRAM COMPLETE
Area-P 1/2: 2.55 cm2
Height: 55.906 in
S' Lateral: 2.1 cm
Weight: 1728 oz

## 2022-05-05 LAB — LIPID PANEL
Cholesterol: 120 mg/dL (ref 0–200)
HDL: 38 mg/dL — ABNORMAL LOW (ref >40)
LDL Cholesterol: 64 mg/dL (ref 0–99)
Total CHOL/HDL Ratio: 3.2 RATIO
Triglycerides: 91 mg/dL (ref <150)
VLDL: 18 mg/dL (ref 0–40)

## 2022-05-05 MED ORDER — MELATONIN 5 MG PO TABS
5.0000 mg | ORAL_TABLET | Freq: Once | ORAL | Status: AC
  Administered 2022-05-05: 5 mg via ORAL
  Filled 2022-05-05: qty 1

## 2022-05-05 NOTE — Assessment & Plan Note (Signed)
Patient takes torsemide at home.  This can be resumed at discharge 

## 2022-05-05 NOTE — Assessment & Plan Note (Signed)
Continue Lipitor and fenofibrate.

## 2022-05-05 NOTE — Progress Notes (Signed)
S: Patient's sx have improved but she still has mild WFD, dysarthria, R facial droop, and RUE weakness all of which are new since just prior to admission.  CT head @ 24 hrs showed no clear e/o evolving ischemia Pt cannot get MRI  TTE pending  Stroke Labs     Component Value Date/Time   CHOL 120 05/05/2022 0438   TRIG 91 05/05/2022 0438   HDL 38 (L) 05/05/2022 0438   CHOLHDL 3.2 05/05/2022 0438   VLDL 18 05/05/2022 0438   LDLCALC 64 05/05/2022 0438    Lab Results  Component Value Date/Time   HGBA1C 4.7 (L) 05/04/2022 01:49 PM   O:  Vitals:   05/05/22 1600 05/05/22 1835  BP:    Pulse:    Resp: 19 16  Temp:    SpO2:      Physical Exam Gen: A&O x4, NAD HEENT: Atraumatic, normocephalic;mucous membranes moist; oropharynx clear, tongue without atrophy or fasciculations. Neck: Supple, trachea midline. Resp: CTAB, no w/r/r CV: RRR, no m/g/r; nml S1 and S2. 2+ symmetric peripheral pulses. Abd: soft/NT/ND; nabs x 4 quad Extrem: Nml bulk; no cyanosis, clubbing. BLE edema   Neuro: *MS: A&O x4. Follows multi-step commands.  *Speech: fluid, minimal dysarthria, naming intact for me but per family has had mild WFD intermittently throughout the day *CN:    I: Deferred   II,III: PERRLA, L upper quadrantopia otherwise VFF by confrontation, optic discs unable to be visualized 2/2 pupillary constriction   III,IV,VI: EOMI w/o nystagmus, no ptosis   V: Sensation intact from V1 to V3 to LT   VII: Eyelid closure was full.  R UMN facial droop   VIII: Extremely hard of hearing   IX,X: Voice normal, palate elevates symmetrically    XI: SCM/trap 5/5 bilat   XII: Tongue protrudes midline, no atrophy or fasciculations  *Motor:   Normal bulk. RUE some movement against gravity, LUE no drift, BLE drift but not to bed, symmetric. *Sensory: SILT. No double-simultaneous extinction.  *Coordination: UTA to adequately assess FNF 2/2 weakness on R, intact on L *Reflexes:  2+ and symmetric throughout  without clonus; toes down-going bilat *Gait: deferred   NIHSS   1a Level of Conscious.: 0 1b LOC Questions: 0 1c LOC Commands: 0 2 Best Gaze: 0 3 Visual: 1 4 Facial Palsy: 1 5a Motor Arm - left: 0 5b Motor Arm - Right: 2 6a Motor Leg - Left: 1 6b Motor Leg - Right: 1 7 Limb Ataxia: 0 8 Sensory: 0 9 Best Language: 0 10 Dysarthria: 1 11 Extinct. and Inatten.: 0   TOTAL: 7 (from 8 yesterday)  Imaging on admission:  CT head:   1. No evidence of acute intracranial hemorrhage, acute cortical infarct or intracranial mass. 2. Tiny age-indeterminate lacunar infarct versus prominent perivascular space within the anterior limb of left internal capsule. A brain MRI may be obtained for further evaluation, as clinically warranted. 3. Small chronic cortically-based infarct within the lateral right occipital lobe (PCA territory). 4. Mild chronic small vessel ischemic changes within the cerebral white matter. 5. Mild generalized cerebral atrophy. 6. Paranasal sinus disease, as described.   CTA neck:   1. The origin of the left common carotid artery is excluded from the field of view. Within this limitation, the common carotid and internal carotid arteries are patent within the neck without stenosis. Mild atherosclerotic plaque, bilaterally. 2. The vertebral arteries are patent within the neck without stenosis. Minimal non-stenotic atherosclerotic plaque within the left V2 segment. 3. Please  note, the origin of the innominate artery is also excluded from the field of view. 4. Aortic Atherosclerosis (ICD10-I70.0). 5. Cervical spondylosis, as described.   CTA head:   1. No intracranial large vessel occlusion. 2. Intracranial atherosclerotic disease, as described. Most notably, there is a moderate focal stenosis within the right PCA P2 segment.   CNS imaging personally reviewed; I agree with above interpretations   Patient has hx stapedectomy with prosthesis of unknown  materials 50 yrs ago; records not available for review, therefore is unable to undergo MRI  A/P: This is a 88 year old woman with past medical history significant for hypertension, hyperlipidemia, COPD, and bilateral leg edema who presents with RUE weakness, R facial droop, dysarthria, and mild WFD now improved but not resolved.   She has new dx a fib this admission (CHADS2-VASc = 6). Patient will ultimately need to be anticoagulated for A-fib but not in the setting of acute ischemic stroke 2/2 risk of hemorrhagic conversion.  Repeat head CT @ 24 hrs did not show evolving ischemia but clinically she did have a stroke - She still has R facial droop, RUE weakness, some dysarthria and mixing up of words. CT head is not nearly as sensitive as MRI for small acute strokes so neg CT does not change my assessment. Recommend continuing to monitor in house. Based on clinical improvement will plan to start anticoag between days 3-5, first with heparin gtt than transition to eliquis. Unfortunately she is allergic to aspirin and had a significant flushing rxn to plavix yesterday so she is not currently on antiplatelets. She was started on DVT prophylaxis dose of lovenox.  - Permissive HTN x48 hrs from sx onset goal BP <220/110. PRN labetalol or hydralazine if BP above these parameters. Avoid oral antihypertensives. - Patient will ultimately need to be anticoagulated for a fib but given her persistent deficits I am concerned that she has acute infarct(s). Based on clinical improvement will plan to start anticoag between days 3-5, first with heparin gtt than transition to eliquis.  - Pt has allergy to ASA, plavix therefore is not on antiplatelets - TTE f/u - Check A1c and LDL + add statin per guidelines - q4 hr neuro checks - STAT head CT for any change in neuro exam - Tele - PT/OT/SLP - Stroke education - Amb referral to neurology upon discharge - Cardiology following, appreciate recs - Will continue to  follow  Bing Neighbors, MD [facility] [phone]  If 7pm- 7am, please page neurology on call as listed in AMION.

## 2022-05-05 NOTE — Assessment & Plan Note (Addendum)
Rate is well-controlled for now.  Considering her CHA2DS2-VASc score of 6 (age, htn, sex, cva) she will need anti-coagulation. Echo within normal limits.  Cardiology following Neurology to decide on timing of heparin and Eliquis

## 2022-05-05 NOTE — Assessment & Plan Note (Addendum)
Permissive hypertension with goal blood pressure less than 220/110 for first 48 hours Patient could not get MRI but based on her presentation with right upper extremity weakness, right facial droop, dysarthria she likely has stroke.  Neurology recommends monitoring in house.  Based on the clinical improvement anticoagulation to be decided.  First with heparin drip and then transition to Eliquis at day 3-5 PT, OT, ST recommends home health Patient did not tolerate Plavix as she developed facial flushing and after administration so that has been held.  She also has a listed allergy to aspirin. Continue Lipitor and fenofibrate Neurology following Repeat CT head this morning shows no acute changes

## 2022-05-05 NOTE — Progress Notes (Signed)
  Progress Note   Patient: Kelly Hawkins OBS:[id number] DOB: [birth year] DOA: 05/04/2022     0 DOS: the patient was seen and examined on 05/05/2022   Brief hospital course: 88 year old female with a known history of hypertension, hyperlipidemia, COPD, myelitis, bilateral lower extremity edema is admitted for suspected stroke  11/22: Neurology recommends monitoring in-house and starting anticoagulation between days 3-5.  First with heparin drip then transition to Eliquis.  Assessment and Plan: * Stroke (HCC) Permissive hypertension with goal blood pressure less than 220/110 for first 48 hours Patient could not get MRI but based on her presentation with right upper extremity weakness, right facial droop, dysarthria she likely has stroke.  Neurology recommends monitoring in house.  Based on the clinical improvement anticoagulation to be decided.  First with heparin drip and then transition to Eliquis at day 3-5 PT, OT, ST recommends home health Patient did not tolerate Plavix as she developed facial flushing and after administration so that has been held.  She also has a listed allergy to aspirin. Continue Lipitor and fenofibrate Neurology following Repeat CT head this morning shows no acute changes  HTN (hypertension) Continue as needed hydralazine and Lopressor  New onset atrial fibrillation (HCC) Rate is well-controlled for now.  Considering her CHA2DS2-VASc score of 6 (age, htn, sex, cva) she will need anti-coagulation. Echo within normal limits.  Cardiology following Neurology to decide on timing of heparin and Eliquis  HLD (hyperlipidemia) Continue Lipitor and fenofibrate  Bilateral leg edema Patient takes torsemide at home.  This can be resumed at discharge        Subjective: No new issues.  Has difficulty hearing.  Son at bedside  Physical Exam: Vitals:   05/05/22 0557 05/05/22 0728 05/05/22 1129 05/05/22 1541  BP: (!) 145/78 (!) 146/75 128/68 123/61  Pulse: 95 (!) 103  (!) 101 94  Resp: 16 16 16 16   Temp: 98.4 F (36.9 C) 98.2 F (36.8 C) 97.6 F (36.4 C) 98.6 F (37 C)  TempSrc:    Oral  SpO2: 96% 96% 94% 96%  Weight:      Height:       88 year old female lying in the bed comfortably without any acute distress Lungs clear to auscultation bilaterally Cardiovascular regular rate and rhythm Abdomen soft, benign Neuro alert and awake, right-sided weaker than left.  Upper extremity weaker than lower Data Reviewed:  There are no new results to review at this time.  Family Communication: Son updated at bedside  Disposition: Status is: Inpatient Remains inpatient appropriate because: Stroke management.  Will need anticoagulation to be started at day 3-5 per neurology for which she needs to be monitored in-house  Planned Discharge Destination: Home with Home Health   DVT prophylaxis-Lovenox Time spent: 35 minutes  Author: 83, MD 05/05/2022 4:15 PM  For on call review www.05/07/2022.

## 2022-05-05 NOTE — Assessment & Plan Note (Signed)
Continue as needed hydralazine and Lopressor 

## 2022-05-05 NOTE — Progress Notes (Signed)
[facility] Encounter Note  Patient: Kelly Hawkins / Admit Date: 05/04/2022 / Date of Encounter: 05/05/2022, 5:13 PM   Subjective: Overall patient has had slight neurologic improvement so from admission.  Patient was placed on enoxaparin and neurology has requested to use enoxaparin early and transitioning to Eliquis at 3 to 5 days.  Atrial fibrillation has now converted to normal sinus rhythm with controlled ventricular rate  Echocardiogram shows normal LV systolic function and no significant amount of valvular heart disease  Review of Systems: Positive for: Shortness of breath Negative for: Vision change, hearing change, syncope, dizziness, nausea, vomiting,diarrhea, bloody stool, stomach pain, cough, congestion, diaphoresis, urinary frequency, urinary pain,skin lesions, skin rashes Others previously listed  Objective: Telemetry: Normal sinus rhythm with bundle branch block Physical Exam: Blood pressure 123/61, pulse 94, temperature 98.6 F (37 C), temperature source Oral, resp. rate 16, height 4' 7.91" (1.42 m), weight 49 kg, SpO2 96 %. Body mass index is 24.29 kg/m. General: Well developed, well nourished, in no acute distress. Head: Normocephalic, atraumatic, sclera non-icteric, no xanthomas, nares are without discharge. Neck: No apparent masses Lungs: Normal respirations with no wheezes, no rhonchi, no rales , no crackles   Heart: Regular rate and rhythm, normal S1 S2, no murmur, no rub, no gallop, PMI is normal size and placement, carotid upstroke normal without bruit, jugular venous pressure normal Abdomen: Soft, non-tender, non-distended with normoactive bowel sounds. No hepatosplenomegaly. Abdominal aorta is normal size without bruit Extremities: Trace edema, no clubbing, no cyanosis, no ulcers,  Peripheral: 2+ radial, 2+ femoral, 2+ dorsal pedal pulses Neuro: Alert and oriented. Moves all extremities spontaneously.  With some neurologic deficit Psych:   Responds to questions appropriately with a normal affect.  No intake or output data in the 24 hours ending 05/05/22 1713  Inpatient Medications:   atorvastatin  40 mg Oral Daily   cholecalciferol  2,000 Units Oral Daily   cyanocobalamin  1,000 mcg Oral Daily   enoxaparin (LOVENOX) injection  30 mg Subcutaneous Q24H   fenofibrate  160 mg Oral Daily   montelukast  10 mg Oral QHS   raloxifene  60 mg Oral Daily   Infusions:   Labs: Recent Labs    05/04/22 0956  NA 142  K 4.0  CL 108  CO2 26  GLUCOSE 96  BUN 29*  CREATININE 0.92  CALCIUM 9.8   Recent Labs    05/04/22 0956  AST 27  ALT 14  ALKPHOS 35*  BILITOT 1.1  PROT 6.8  ALBUMIN 3.7   Recent Labs    05/04/22 0956  WBC 9.3  NEUTROABS 6.5  HGB 12.1  HCT 37.7  MCV 92.2  PLT 398   No results for input(s): "CKTOTAL", "CKMB", "TROPONINI" in the last 72 hours. Invalid input(s): "POCBNP" Recent Labs    05/04/22 1349  HGBA1C 4.7*     Weights: Filed Weights   05/04/22 0958  Weight: 49 kg     Radiology/Studies:  ECHOCARDIOGRAM COMPLETE  Result Date: 05/05/2022    ECHOCARDIOGRAM REPORT   Patient Name:   Kelly Hawkins Date of Exam: 05/04/2022 Medical Rec #:  [id number]    Height:       55.9 in Accession #:    [id number]   Weight:       108.0 lb Date of Birth:  [birth year]     BSA:          1.365 m Patient Age:    88 years     BP:  149/82 mmHg Patient Gender: F            HR:           99 bpm. Exam Location:  [facility] Procedure: 2D Echo, Cardiac Doppler and Color Doppler Indications:     I63.9 Stroke  History:         Patient has no prior history of Echocardiogram examinations.                  COPD; Risk Factors:Hypertension and Dyslipidemia.  Sonographer:     Daphine Deutscher RDCS Referring Phys:  [id number] Cheryln Manly TANG Diagnosing Phys: Arnoldo Hooker MD IMPRESSIONS  1. Left ventricular ejection fraction, by estimation, is 65 to 70%. The left ventricle has normal function. The left ventricle has no  regional wall motion abnormalities. Left ventricular diastolic parameters were normal.  2. Right ventricular systolic function is normal. The right ventricular size is normal.  3. The mitral valve is normal in structure. Mild mitral valve regurgitation.  4. The aortic valve is normal in structure. Aortic valve regurgitation is not visualized. FINDINGS  Left Ventricle: Left ventricular ejection fraction, by estimation, is 65 to 70%. The left ventricle has normal function. The left ventricle has no regional wall motion abnormalities. The left ventricular internal cavity size was normal in size. There is  no left ventricular hypertrophy. Left ventricular diastolic parameters were normal. Right Ventricle: The right ventricular size is normal. No increase in right ventricular wall thickness. Right ventricular systolic function is normal. Left Atrium: Left atrial size was normal in size. Right Atrium: Right atrial size was normal in size. Pericardium: There is no evidence of pericardial effusion. Mitral Valve: The mitral valve is normal in structure. Mild mitral valve regurgitation. Tricuspid Valve: The tricuspid valve is normal in structure. Tricuspid valve regurgitation is mild. Aortic Valve: The aortic valve is normal in structure. Aortic valve regurgitation is not visualized. Pulmonic Valve: The pulmonic valve was normal in structure. Pulmonic valve regurgitation is trivial. Aorta: The aortic root and ascending aorta are structurally normal, with no evidence of dilitation. IAS/Shunts: No atrial level shunt detected by color flow Doppler.  LEFT VENTRICLE PLAX 2D LVIDd:         3.90 cm   Diastology LVIDs:         2.10 cm   LV e' medial:    6.22 cm/s LV PW:         1.00 cm   LV E/e' medial:  10.5 LV IVS:        1.00 cm   LV e' lateral:   9.01 cm/s LVOT diam:     1.90 cm   LV E/e' lateral: 7.3 LV SV:         50 LV SV Index:   37 LVOT Area:     2.84 cm  RIGHT VENTRICLE             IVC RV Basal diam:  2.60 cm     IVC diam:  1.00 cm RV S prime:     18.47 cm/s TAPSE (M-mode): 2.1 cm LEFT ATRIUM             Index        RIGHT ATRIUM           Index LA diam:        3.80 cm 2.78 cm/m   RA Area:     14.90 cm LA Vol (A2C):   37.5 ml 27.48 ml/m  RA Volume:  40.60 ml  29.75 ml/m LA Vol (A4C):   34.1 ml 24.99 ml/m LA Biplane Vol: 37.6 ml 27.55 ml/m  AORTIC VALVE LVOT Vmax:   114.00 cm/s LVOT Vmean:  76.433 cm/s LVOT VTI:    0.178 m  AORTA Ao Root diam: 3.20 cm Ao Asc diam:  3.60 cm MITRAL VALVE MV Area (PHT): 2.55 cm     SHUNTS MV Decel Time: 298 msec     Systemic VTI:  0.18 m MV E velocity: 65.50 cm/s   Systemic Diam: 1.90 cm MV A velocity: 115.50 cm/s MV E/A ratio:  0.57 Arnoldo Hooker MD Electronically signed by Arnoldo Hooker MD Signature Date/Time: 05/05/2022/3:22:05 PM    Final    CT HEAD WO CONTRAST ( )  Result Date: 05/05/2022 CLINICAL DATA:  Neuro deficit, acute, stroke suspected EXAM: CT HEAD WITHOUT CONTRAST TECHNIQUE: Contiguous axial images were obtained from the base of the skull through the vertex without intravenous contrast. RADIATION DOSE REDUCTION: This exam was performed according to the departmental dose-optimization program which includes automated exposure control, adjustment of the mA and/or kV according to patient size and/or use of iterative reconstruction technique. COMPARISON:  CT head May 04, 2022. FINDINGS: Brain: Similar chronic appearing cortical/subcortical infarct in the right occipital lobe. No evidence of acute large vascular territory infarct, acute hemorrhage, mass lesion or midline shift. Vascular: No hyperdense vessel identified. Skull: No acute fracture. Sinuses/Orbits: Opacified right maxillary sinus, partially imaged. No acute orbital findings. Other: No mastoid effusions. IMPRESSION: 1. No definite evidence of acute intracranial abnormality. 2. Similar chronic appearing cortical/subcortical infarct in the right occipital lobe. An MRI could provide more sensitive evaluation for acute  infarct if the patient is able. Electronically Signed   By: Feliberto Harts M.D.   On: 05/05/2022 10:32   US Venous Img Lower Bilateral (DVT)  Result Date: 05/04/2022 CLINICAL DATA:  Leg edema in a 88 year old female. EXAM: Bilateral LOWER EXTREMITY VENOUS DOPPLER ULTRASOUND TECHNIQUE: Gray-scale sonography with compression, as well as color and duplex ultrasound, were performed to evaluate the deep venous system(s) from the level of the common femoral vein through the popliteal and proximal calf veins. COMPARISON:  None available FINDINGS: VENOUS Normal compressibility of the common femoral, superficial femoral, and popliteal veins, as well as the visualized calf veins. Visualized portions of profunda femoral vein and great saphenous vein unremarkable. No filling defects to suggest DVT on grayscale or color Doppler imaging. Doppler waveforms show normal direction of venous flow, normal respiratory plasticity and response to augmentation. OTHER 4.3 x 1.4 x 2.6 cm Baker's cyst in the LEFT popliteal fossa. Signs of edema in the lower leg in particular in the subcutaneous tissues. This may be slightly greater on the LEFT than the RIGHT. Limitations: None. IMPRESSION: Negative for deep venous thrombosis in the LEFT or RIGHT lower extremity with signs of lower extremity edema and LEFT popliteal Baker's cyst. Electronically Signed   By: Donzetta Kohut M.D.   On: 05/04/2022 13:27   CT ANGIO HEAD NECK W WO CM  Result Date: 05/04/2022 CLINICAL DATA:  Provided history: Neuro deficit, acute, stroke suspected. Additional history provided: Right facial droop, leaning to the right, reported symptoms resolved upon EMS arrival. EXAM: CT ANGIOGRAPHY HEAD AND NECK TECHNIQUE: Multidetector CT imaging of the head and neck was performed using the standard protocol during bolus administration of intravenous contrast. Multiplanar CT image reconstructions and MIPs were obtained to evaluate the vascular anatomy. Carotid stenosis  measurements (when applicable) are obtained utilizing NASCET criteria, using the distal  internal carotid diameter as the denominator. RADIATION DOSE REDUCTION: This exam was performed according to the departmental dose-optimization program which includes automated exposure control, adjustment of the mA and/or kV according to patient size and/or use of iterative reconstruction technique. CONTRAST:  50mL OMNIPAQUE IOHEXOL 350 MG/ML SOLN COMPARISON:  No pertinent prior exams available for comparison. FINDINGS: CT HEAD FINDINGS Brain: Mild generalized cerebral atrophy. Small chronic appearing cortical/subcortical infarct within the right occipital lobe (right PCA territory) (for instance as seen on series 3, image 14). Mild patchy and ill-defined hypoattenuation within the cerebral white matter. There is a small hypodense focus within the anterior limb of left internal capsule, which may reflect a prominent perivascular space or tiny age-indeterminate lacunar infarct (best appreciated on series 6, image 23). There is no acute intracranial hemorrhage. No demarcated cortical infarct. No extra-axial fluid collection. No evidence of an intracranial mass. No midline shift. Vascular: No hyperdense vessel.  Atherosclerotic calcifications. Skull: No fracture or aggressive osseous lesion. Sinuses/Orbits: No mass or acute finding within the imaged orbits. Complete opacification of the right maxillary sinus. Mild mucosal thickening scattered within the mild mucosal thickening, and possible fluid, scattered within the bilateral ethmoid air cells. Review of the MIP images confirms the above findings CTA NECK FINDINGS Aortic arch: The origins of the innominate and left common carotid arteries are excluded from the field of view. Atherosclerotic plaque within the visualized aortic arch and proximal major branch vessels of the neck. Within described limitations, there is no appreciable hemodynamically significant stenosis within the  innominate or proximal subclavian arteries. Right carotid system: CCA and ICA patent within the neck without stenosis. Mild atherosclerotic plaque within the CCA and cervical ICA. Left carotid system: The origin of the common carotid artery is excluded from the field of view. Within this limitation, the CCA and ICA are patent within the neck without stenosis. Mild atherosclerotic plaque within the CCA and carotid bifurcation. Vertebral arteries: Codominant and patent within the neck without stenosis. Minimal nonstenotic atherosclerotic plaque within the left V2 segment. Skeleton: Reversal of the expected cervical lordosis. Slight C4-C5 and C5-C6 grade 1 anterolisthesis. Cervical spondylosis. Most notably, advanced disc space narrowing is present at C5-C6, C6-C7 and C7-T1. Facet joint ankylosis on the left at C3-C4. Other neck: No neck mass or cervical lymphadenopathy. Upper chest: No consolidation within the imaged lung apices. Review of the MIP images confirms the above findings CTA HEAD FINDINGS Anterior circulation: The intracranial internal carotid arteries are patent. Atherosclerotic plaque within both vessels with with no more than mild stenosis. The M1 middle cerebral arteries are patent. Atherosclerotic irregularity of the M2 and more distal MCA vessels, bilaterally. No M2 proximal branch occlusion or high-grade proximal stenosis. The anterior cerebral arteries are patent. No intracranial aneurysm is identified. Posterior circulation: The intracranial vertebral arteries are patent. Nonstenotic atherosclerotic plaque within both vessels. The basilar artery is patent. The posterior cerebral arteries are patent. Atherosclerotic irregularity of the right PCA. Most notably, there is a moderate focal stenosis within the proximal right P2 segment (series 11, image 22) (series 15, image 13). A small left posterior communicating artery is present. The right posterior communicating artery is diminutive or absent.  Venous sinuses: Within the limitations of contrast timing, no convincing thrombus. Anatomic variants: As described. Review of the MIP images confirms the above findings IMPRESSION: CT head: 1. No evidence of acute intracranial hemorrhage, acute cortical infarct or intracranial mass. 2. Tiny age-indeterminate lacunar infarct versus prominent perivascular space within the anterior limb of left internal  capsule. A brain MRI may be obtained for further evaluation, as clinically warranted. 3. Small chronic cortically-based infarct within the lateral right occipital lobe (PCA territory). 4. Mild chronic small vessel ischemic changes within the cerebral white matter. 5. Mild generalized cerebral atrophy. 6. Paranasal sinus disease, as described. CTA neck: 1. The origin of the left common carotid artery is excluded from the field of view. Within this limitation, the common carotid and internal carotid arteries are patent within the neck without stenosis. Mild atherosclerotic plaque, bilaterally. 2. The vertebral arteries are patent within the neck without stenosis. Minimal non-stenotic atherosclerotic plaque within the left V2 segment. 3. Please note, the origin of the innominate artery is also excluded from the field of view. 4. Aortic Atherosclerosis (ICD10-I70.0). 5. Cervical spondylosis, as described. CTA head: 1. No intracranial large vessel occlusion. 2. Intracranial atherosclerotic disease, as described. Most notably, there is a moderate focal stenosis within the right PCA P2 segment. Electronically Signed   By: Jackey Loge D.O.   On: 05/04/2022 10:50     Assessment and Recommendation  88 y.o. female with acute stroke with neurologic deficit and paroxysmal nonvalvular atrial fibrillation now spontaneously converted to normal sinus rhythm and improving neurologic deficits needing continued medication management 1.  No further intervention or other cardiac diagnostics necessary at this time due to normal LV  systolic function by echocardiogram 2.  No additional treatment of atrial fibrillation at this time due to patient maintaining normal sinus rhythm 3.  Anticoagulation with enoxaparin transitioning to Eliquis at 3 to 5 days  Signed, Arnoldo Hooker M.D. FACC

## 2022-05-05 NOTE — Progress Notes (Signed)
       CROSS COVER NOTE  NAME: Kelly Hawkins MRN: [id number] DOB : June 06, 1927 ATTENDING PHYSICIAN: Lorretta Harp, MD    Date of Service   05/05/2022   HPI/Events of Note   Medication request received for sleep aid.  Interventions   Assessment/Plan:  Melatonin     This document was prepared using Dragon voice recognition software and may include unintentional dictation errors.  Bishop Limbo DNP, MBA, FNP-BC Nurse Practitioner [facility] Pager [phone]

## 2022-05-05 NOTE — Hospital Course (Addendum)
88 year old female with a known history of hypertension, hyperlipidemia, COPD, myelitis, bilateral lower extremity edema is admitted for suspected stroke  11/22-23: Neurology recommends monitoring in-house and starting anticoagulation between days 3-5.  First with heparin drip then transition to Eliquis. 11/24: Hospice eval

## 2022-05-05 NOTE — TOC Initial Note (Signed)
Transition of Care [facility]) - Initial/Assessment Note    Patient Details  Name: Kelly Hawkins MRN: [id number] Date of Birth: 1927-01-22  Transition of [facility]) CM/SW Contact:    Colen Darling, Ward Phone Number: 05/05/2022, 12:43 PM  Clinical Narrative:                  TOC met with the patient at bedside. Her son Wille Glaser was at bedside and he is her HCPOA and he drives her to appointments. The patient picks up medications at [facility] in [place].   Her home address is [address] in [place], [place].  Expected Discharge Plan: [place]     Patient Goals and CMS Choice Patient states their goals for this hospitalization and ongoing recovery are:: return home with hoe health      Expected Discharge Plan and Services Expected Discharge Plan: [place]                                              Prior Living Arrangements/Services       Do you feel safe going back to the place where you live?: Yes               Activities of Daily Living Home Assistive Devices/Equipment: Cane (specify quad or straight), Wheelchair, Environmental consultant (specify type) ADL Screening (condition at time of admission) Patient's cognitive ability adequate to safely complete daily activities?: No Is the patient deaf or have difficulty hearing?: Yes Does the patient have difficulty seeing, even when wearing glasses/contacts?: No Does the patient have difficulty concentrating, remembering, or making decisions?: No Patient able to express need for assistance with ADLs?: Yes Does the patient have difficulty dressing or bathing?: No Independently performs ADLs?: Yes (appropriate for developmental age) Does the patient have difficulty walking or climbing stairs?: Yes Weakness of Legs: None Weakness of Arms/Hands: Both  Permission Sought/Granted                  Emotional Assessment Appearance:: Appears stated age Attitude/Demeanor/Rapport:  Gracious Affect (typically observed): Appropriate Orientation: : Oriented to Place, Oriented to Self, Oriented to Situation      Admission diagnosis:  TIA (transient ischemic attack) [G45.9] Stroke [facility]) [I63.9] Right arm weakness [R29.898] Word finding difficulty [R47.89] Atrial fibrillation, unspecified type [facility]) [I48.91] Patient Active Problem List   Diagnosis Date Noted   TIA (transient ischemic attack) 05/05/2022   Stroke ([place]) 05/04/2022   HTN (hypertension) 05/04/2022   HLD (hyperlipidemia) 05/04/2022   COPD (chronic obstructive pulmonary disease) ([place]) 05/04/2022   Bilateral leg edema 05/04/2022   New onset atrial fibrillation ([place]) 05/04/2022   PCP:  Merryl Hacker, No Pharmacy:   [id number] [address]on, [place] - [address] [place] [address] Phone: [phone] Fax: [phone]     Social Determinants of Health (SDOH) Interventions    Readmission Risk Interventions     No data to display

## 2022-05-05 NOTE — Progress Notes (Signed)
Physical Therapy Treatment Patient Details Name: Kelly Hawkins MRN: [id number] DOB: Mar 19, 1927 Today's Date: 05/05/2022   History of Present Illness Pt admitted to [facility] on 05/04/22 for c/o stroke like symptoms including: RUE weakness, facial droop, and word finding difficulties. Symptoms resolved by the time she came to the ED. Per Dr. Modesto Charon this is not a code stroke, but requested CT imaging. Significant PMH includes: COPD, HTN, and chronic pain.    PT Comments    Pt is making good progress towards goals. Son in room stating that pt's baseline is limited household ambulator using RW. Pt agreeable to room ambulation using RW and needed to use bathroom. Once finished with ADLs, pt requested not to perform further exercises. Returned back to bed. Will continue to progress towards goals. Pt very sweet and motivated to return home.   Recommendations for follow up therapy are one component of a multi-disciplinary discharge planning process, led by the attending physician.  Recommendations may be updated based on patient status, additional functional criteria and insurance authorization.  Follow Up Recommendations  Home health PT     Assistance Recommended at Discharge Intermittent Supervision/Assistance  Patient can return home with the following A little help with walking and/or transfers;A little help with bathing/dressing/bathroom;Assistance with cooking/housework;Direct supervision/assist for medications management;Direct supervision/assist for financial management;Assist for transportation;Help with stairs or ramp for entrance   Equipment Recommendations  BSC/3in1    Recommendations for Other Services       Precautions / Restrictions Precautions Precautions: Fall Restrictions Weight Bearing Restrictions: No     Mobility  Bed Mobility Overal bed mobility: Needs Assistance Bed Mobility: Supine to Sit     Supine to sit: Supervision Sit to supine: Supervision   General bed  mobility comments: improved technique compared to previous session. Able to reposition self in bed with supervision.    Transfers Overall transfer level: Needs assistance Equipment used: Rolling walker (2 wheels) Transfers: Sit to/from Stand Sit to Stand: Min guard           General transfer comment: able to push from seated surface. Slight unsteadiness with initial stance. RW used. Poor posture due to scoliosis.    Ambulation/Gait Ambulation/Gait assistance: Min guard Gait Distance (Feet): 40 Feet Assistive device: Rolling walker (2 wheels) Gait Pattern/deviations: Step-through pattern       General Gait Details: short step length and slow speed. Pt declined to ambulate in hallway, however agreeable to room distances. RW used. No dizziness noted   Stairs             Wheelchair Mobility    Modified Rankin (Stroke Patients Only)       Balance Overall balance assessment: Needs assistance Sitting-balance support: Bilateral upper extremity supported Sitting balance-Leahy Scale: Good     Standing balance support: During functional activity, No upper extremity supported Standing balance-Leahy Scale: Good                              Cognition Arousal/Alertness: Awake/alert Behavior During Therapy: WFL for tasks assessed/performed Overall Cognitive Status: Within Functional Limits for tasks assessed                                 General Comments: HOH, however pleasant. Reading a book in bed upon arrival        Exercises Other Exercises Other Exercises: pt declined to participate in seated ther-ex stating "  Its best if I rest right now" Other Exercises: ambulated to bathroom. Able to doff clothes with supervision and perform hygiene. Fair balance during hand washing with static stance    General Comments        Pertinent Vitals/Pain Pain Assessment Pain Assessment: No/denies pain    Home Living                           Prior Function            PT Goals (current goals can now be found in the care plan section) Acute Rehab PT Goals Patient Stated Goal: "go home" PT Goal Formulation: With patient/family Time For Goal Achievement: 05/18/22 Potential to Achieve Goals: Good Progress towards PT goals: Progressing toward goals    Frequency    Min 2X/week      PT Plan Current plan remains appropriate    Co-evaluation              AM-PAC PT "6 Clicks" Mobility   Outcome Measure  Help needed turning from your back to your side while in a flat bed without using bedrails?: None Help needed moving from lying on your back to sitting on the side of a flat bed without using bedrails?: None Help needed moving to and from a bed to a chair (including a wheelchair)?: A Little Help needed standing up from a chair using your arms (e.g., wheelchair or bedside chair)?: A Little Help needed to walk in hospital room?: A Little Help needed climbing 3-5 steps with a railing? : A Lot 6 Click Score: 19    End of Session   Activity Tolerance: Patient tolerated treatment well;Patient limited by fatigue Patient left: in bed;with call bell/phone within reach;with family/visitor present Nurse Communication: Mobility status PT Visit Diagnosis: Unsteadiness on feet (R26.81);Muscle weakness (generalized) (M62.81);Difficulty in walking, not elsewhere classified (R26.2)     Time: 2500-3704 PT Time Calculation (min) (ACUTE ONLY): 19 min  Charges:  $Gait Training: 8-22 mins                     Elizabeth Hawkins, PT, DPT, GCS [phone]    Kelly Hawkins 05/05/2022, 3:58 PM

## 2022-05-06 ENCOUNTER — Inpatient Hospital Stay: Payer: Medicare Other

## 2022-05-06 DIAGNOSIS — J449 Chronic obstructive pulmonary disease, unspecified: Secondary | ICD-10-CM | POA: Diagnosis not present

## 2022-05-06 DIAGNOSIS — I639 Cerebral infarction, unspecified: Secondary | ICD-10-CM | POA: Diagnosis not present

## 2022-05-06 DIAGNOSIS — I10 Essential (primary) hypertension: Secondary | ICD-10-CM | POA: Diagnosis not present

## 2022-05-06 DIAGNOSIS — I4891 Unspecified atrial fibrillation: Secondary | ICD-10-CM | POA: Diagnosis not present

## 2022-05-06 LAB — CBC
HCT: 35.4 % — ABNORMAL LOW (ref 36.0–46.0)
Hemoglobin: 11.7 g/dL — ABNORMAL LOW (ref 12.0–15.0)
MCH: 30 pg (ref 26.0–34.0)
MCHC: 33.1 g/dL (ref 30.0–36.0)
MCV: 90.8 fL (ref 80.0–100.0)
Platelets: 370 10*3/uL (ref 150–400)
RBC: 3.9 MIL/uL (ref 3.87–5.11)
RDW: 14.3 % (ref 11.5–15.5)
WBC: 8.8 10*3/uL (ref 4.0–10.5)
nRBC: 0 % (ref 0.0–0.2)

## 2022-05-06 LAB — URINALYSIS, ROUTINE W REFLEX MICROSCOPIC
Bilirubin Urine: NEGATIVE
Glucose, UA: NEGATIVE mg/dL
Hgb urine dipstick: NEGATIVE
Ketones, ur: NEGATIVE mg/dL
Nitrite: NEGATIVE
Protein, ur: NEGATIVE mg/dL
Specific Gravity, Urine: 1.006 (ref 1.005–1.030)
pH: 7 (ref 5.0–8.0)

## 2022-05-06 LAB — BASIC METABOLIC PANEL
Anion gap: 5 (ref 5–15)
BUN: 17 mg/dL (ref 8–23)
CO2: 21 mmol/L — ABNORMAL LOW (ref 22–32)
Calcium: 9.7 mg/dL (ref 8.9–10.3)
Chloride: 113 mmol/L — ABNORMAL HIGH (ref 98–111)
Creatinine, Ser: 0.74 mg/dL (ref 0.44–1.00)
GFR, Estimated: >60 mL/min (ref >60)
Glucose, Bld: 87 mg/dL (ref 70–99)
Potassium: 3.4 mmol/L — ABNORMAL LOW (ref 3.5–5.1)
Sodium: 139 mmol/L (ref 135–145)

## 2022-05-06 LAB — MAGNESIUM: Magnesium: 2 mg/dL (ref 1.7–2.4)

## 2022-05-06 MED ORDER — POTASSIUM CHLORIDE CRYS ER 20 MEQ PO TBCR
40.0000 meq | EXTENDED_RELEASE_TABLET | Freq: Once | ORAL | Status: AC
  Administered 2022-05-06: 40 meq via ORAL
  Filled 2022-05-06: qty 2

## 2022-05-06 NOTE — Progress Notes (Signed)
       CROSS COVER NOTE  NAME: Kelly Hawkins MRN: [id number] DOB : 1927/03/02    Time of Service   0500 am  HPI/Events of Note   Nurse reports new onset of very frequent small volume voids. Patient denied burning or pain with urination Prior CBC with minimal elevation of white cell UA with reflex micro ordered +leukocytes, rare bacteria  but no lactate  Assessment and  Interventions   Assessment: Bladder scan reported 11 ml Plan: Defer treatment of uti based on culture unless additional symptoms develop      Donnie Mesa NP [facility]

## 2022-05-06 NOTE — Progress Notes (Signed)
[facility] Cardiology [facility] Encounter Note  Patient: Kelly Hawkins / Admit Date: 05/04/2022 / Date of Encounter: 05/06/2022, 5:30 AM   Subjective: 11/22.  Overall patient has had slight neurologic improvement so from admission.  Patient was placed on enoxaparin and neurology has requested to use enoxaparin early and transitioning to Eliquis at 3 to 5 days.  Atrial fibrillation has now converted to normal sinus rhythm with controlled ventricular rate  11/23.  Patient has been able to rest overnight.  There has been no evidence of issues from a cardiovascular standpoint.  The patient is slowly recovering from stroke with improved movements.  The patient remains in normal sinus rhythm at this time.  Echocardiogram shows normal LV systolic function and no significant amount of valvular heart disease  Review of Systems: Positive for: Shortness of breath Negative for: Vision change, hearing change, syncope, dizziness, nausea, vomiting,diarrhea, bloody stool, stomach pain, cough, congestion, diaphoresis, urinary frequency, urinary pain,skin lesions, skin rashes Others previously listed  Objective: Telemetry: Normal sinus rhythm with bundle branch block Physical Exam: Blood pressure (!) 141/81, pulse 86, temperature 97.7 F (36.5 C), resp. rate 15, height 4' 7.91" (1.42 m), weight 49 kg, SpO2 96 %. Body mass index is 24.29 kg/m. General: Well developed, well nourished, in no acute distress. Head: Normocephalic, atraumatic, sclera non-icteric, no xanthomas, nares are without discharge. Neck: No apparent masses Lungs: Normal respirations with no wheezes, no rhonchi, no rales , no crackles   Heart: Regular rate and rhythm, normal S1 S2, no murmur, no rub, no gallop, PMI is normal size and placement, carotid upstroke normal without bruit, jugular venous pressure normal Abdomen: Soft, non-tender, non-distended with normoactive bowel sounds. No hepatosplenomegaly. Abdominal aorta is normal size  without bruit Extremities: Trace edema, no clubbing, no cyanosis, no ulcers,  Peripheral: 2+ radial, 2+ femoral, 2+ dorsal pedal pulses Neuro: Alert and oriented. Moves all extremities spontaneously.  With some neurologic deficit Psych:  Responds to questions appropriately with a normal affect.   Intake/Output Summary (Last 24 hours) at 05/06/2022 0530 Last data filed at 05/05/2022 1800 Gross per 24 hour  Intake 100 ml  Output --  Net 100 ml    Inpatient Medications:   atorvastatin  40 mg Oral Daily   cholecalciferol  2,000 Units Oral Daily   cyanocobalamin  1,000 mcg Oral Daily   enoxaparin (LOVENOX) injection  30 mg Subcutaneous Q24H   fenofibrate  160 mg Oral Daily   montelukast  10 mg Oral QHS   raloxifene  60 mg Oral Daily   Infusions:   Labs: Recent Labs    05/04/22 0956  NA 142  K 4.0  CL 108  CO2 26  GLUCOSE 96  BUN 29*  CREATININE 0.92  CALCIUM 9.8    Recent Labs    05/04/22 0956  AST 27  ALT 14  ALKPHOS 35*  BILITOT 1.1  PROT 6.8  ALBUMIN 3.7    Recent Labs    05/04/22 0956  WBC 9.3  NEUTROABS 6.5  HGB 12.1  HCT 37.7  MCV 92.2  PLT 398    No results for input(s): "CKTOTAL", "CKMB", "TROPONINI" in the last 72 hours. Invalid input(s): "POCBNP" Recent Labs    05/04/22 1349  HGBA1C 4.7*      Weights: Filed Weights   05/04/22 0958  Weight: 49 kg     Radiology/Studies:  ECHOCARDIOGRAM COMPLETE  Result Date: 05/05/2022    ECHOCARDIOGRAM REPORT   Patient Name:   Kelly Hawkins Date of Exam:  05/04/2022 Medical Rec #:  [id number]    Height:       55.9 in Accession #:    [id number]   Weight:       108.0 lb Date of Birth:  01/18/1927     BSA:          1.365 m Patient Age:    88 years     BP:           149/82 mmHg Patient Gender: F            HR:           99 bpm. Exam Location:  [facility] Procedure: 2D Echo, Cardiac Doppler and Color Doppler Indications:     I63.9 Stroke  History:         Patient has no prior history of Echocardiogram  examinations.                  COPD; Risk Factors:Hypertension and Dyslipidemia.  Sonographer:     Daphine Deutscher RDCS Referring Phys:  [id number] Cheryln Manly TANG Diagnosing Phys: Arnoldo Hooker MD IMPRESSIONS  1. Left ventricular ejection fraction, by estimation, is 65 to 70%. The left ventricle has normal function. The left ventricle has no regional wall motion abnormalities. Left ventricular diastolic parameters were normal.  2. Right ventricular systolic function is normal. The right ventricular size is normal.  3. The mitral valve is normal in structure. Mild mitral valve regurgitation.  4. The aortic valve is normal in structure. Aortic valve regurgitation is not visualized. FINDINGS  Left Ventricle: Left ventricular ejection fraction, by estimation, is 65 to 70%. The left ventricle has normal function. The left ventricle has no regional wall motion abnormalities. The left ventricular internal cavity size was normal in size. There is  no left ventricular hypertrophy. Left ventricular diastolic parameters were normal. Right Ventricle: The right ventricular size is normal. No increase in right ventricular wall thickness. Right ventricular systolic function is normal. Left Atrium: Left atrial size was normal in size. Right Atrium: Right atrial size was normal in size. Pericardium: There is no evidence of pericardial effusion. Mitral Valve: The mitral valve is normal in structure. Mild mitral valve regurgitation. Tricuspid Valve: The tricuspid valve is normal in structure. Tricuspid valve regurgitation is mild. Aortic Valve: The aortic valve is normal in structure. Aortic valve regurgitation is not visualized. Pulmonic Valve: The pulmonic valve was normal in structure. Pulmonic valve regurgitation is trivial. Aorta: The aortic root and ascending aorta are structurally normal, with no evidence of dilitation. IAS/Shunts: No atrial level shunt detected by color flow Doppler.  LEFT VENTRICLE PLAX 2D LVIDd:          3.90 cm   Diastology LVIDs:         2.10 cm   LV e' medial:    6.22 cm/s LV PW:         1.00 cm   LV E/e' medial:  10.5 LV IVS:        1.00 cm   LV e' lateral:   9.01 cm/s LVOT diam:     1.90 cm   LV E/e' lateral: 7.3 LV SV:         50 LV SV Index:   37 LVOT Area:     2.84 cm  RIGHT VENTRICLE             IVC RV Basal diam:  2.60 cm     IVC diam: 1.00 cm RV S prime:  18.47 cm/s TAPSE (M-mode): 2.1 cm LEFT ATRIUM             Index        RIGHT ATRIUM           Index LA diam:        3.80 cm 2.78 cm/m   RA Area:     14.90 cm LA Vol (A2C):   37.5 ml 27.48 ml/m  RA Volume:   40.60 ml  29.75 ml/m LA Vol (A4C):   34.1 ml 24.99 ml/m LA Biplane Vol: 37.6 ml 27.55 ml/m  AORTIC VALVE LVOT Vmax:   114.00 cm/s LVOT Vmean:  76.433 cm/s LVOT VTI:    0.178 m  AORTA Ao Root diam: 3.20 cm Ao Asc diam:  3.60 cm MITRAL VALVE MV Area (PHT): 2.55 cm     SHUNTS MV Decel Time: 298 msec     Systemic VTI:  0.18 m MV E velocity: 65.50 cm/s   Systemic Diam: 1.90 cm MV A velocity: 115.50 cm/s MV E/A ratio:  0.57 Arnoldo Hooker MD Electronically signed by Arnoldo Hooker MD Signature Date/Time: 05/05/2022/3:22:05 PM    Final    CT HEAD WO CONTRAST ( )  Result Date: 05/05/2022 CLINICAL DATA:  Neuro deficit, acute, stroke suspected EXAM: CT HEAD WITHOUT CONTRAST TECHNIQUE: Contiguous axial images were obtained from the base of the skull through the vertex without intravenous contrast. RADIATION DOSE REDUCTION: This exam was performed according to the departmental dose-optimization program which includes automated exposure control, adjustment of the mA and/or kV according to patient size and/or use of iterative reconstruction technique. COMPARISON:  CT head May 04, 2022. FINDINGS: Brain: Similar chronic appearing cortical/subcortical infarct in the right occipital lobe. No evidence of acute large vascular territory infarct, acute hemorrhage, mass lesion or midline shift. Vascular: No hyperdense vessel identified. Skull: No  acute fracture. Sinuses/Orbits: Opacified right maxillary sinus, partially imaged. No acute orbital findings. Other: No mastoid effusions. IMPRESSION: 1. No definite evidence of acute intracranial abnormality. 2. Similar chronic appearing cortical/subcortical infarct in the right occipital lobe. An MRI could provide more sensitive evaluation for acute infarct if the patient is able. Electronically Signed   By: Feliberto Harts M.D.   On: 05/05/2022 10:32   US Venous Img Lower Bilateral (DVT)  Result Date: 05/04/2022 CLINICAL DATA:  Leg edema in a 88 year old female. EXAM: Bilateral LOWER EXTREMITY VENOUS DOPPLER ULTRASOUND TECHNIQUE: Gray-scale sonography with compression, as well as color and duplex ultrasound, were performed to evaluate the deep venous system(s) from the level of the common femoral vein through the popliteal and proximal calf veins. COMPARISON:  None available FINDINGS: VENOUS Normal compressibility of the common femoral, superficial femoral, and popliteal veins, as well as the visualized calf veins. Visualized portions of profunda femoral vein and great saphenous vein unremarkable. No filling defects to suggest DVT on grayscale or color Doppler imaging. Doppler waveforms show normal direction of venous flow, normal respiratory plasticity and response to augmentation. OTHER 4.3 x 1.4 x 2.6 cm Baker's cyst in the LEFT popliteal fossa. Signs of edema in the lower leg in particular in the subcutaneous tissues. This may be slightly greater on the LEFT than the RIGHT. Limitations: None. IMPRESSION: Negative for deep venous thrombosis in the LEFT or RIGHT lower extremity with signs of lower extremity edema and LEFT popliteal Baker's cyst. Electronically Signed   By: Donzetta Kohut M.D.   On: 05/04/2022 13:27   CT ANGIO HEAD NECK W WO CM  Result Date: 05/04/2022 CLINICAL DATA:  Provided history: Neuro deficit, acute, stroke suspected. Additional history provided: Right facial droop, leaning to  the right, reported symptoms resolved upon EMS arrival. EXAM: CT ANGIOGRAPHY HEAD AND NECK TECHNIQUE: Multidetector CT imaging of the head and neck was performed using the standard protocol during bolus administration of intravenous contrast. Multiplanar CT image reconstructions and MIPs were obtained to evaluate the vascular anatomy. Carotid stenosis measurements (when applicable) are obtained utilizing NASCET criteria, using the distal internal carotid diameter as the denominator. RADIATION DOSE REDUCTION: This exam was performed according to the departmental dose-optimization program which includes automated exposure control, adjustment of the mA and/or kV according to patient size and/or use of iterative reconstruction technique. CONTRAST:  41mL OMNIPAQUE IOHEXOL 350 MG/ML SOLN COMPARISON:  No pertinent prior exams available for comparison. FINDINGS: CT HEAD FINDINGS Brain: Mild generalized cerebral atrophy. Small chronic appearing cortical/subcortical infarct within the right occipital lobe (right PCA territory) (for instance as seen on series 3, image 14). Mild patchy and ill-defined hypoattenuation within the cerebral white matter. There is a small hypodense focus within the anterior limb of left internal capsule, which may reflect a prominent perivascular space or tiny age-indeterminate lacunar infarct (best appreciated on series 6, image 23). There is no acute intracranial hemorrhage. No demarcated cortical infarct. No extra-axial fluid collection. No evidence of an intracranial mass. No midline shift. Vascular: No hyperdense vessel.  Atherosclerotic calcifications. Skull: No fracture or aggressive osseous lesion. Sinuses/Orbits: No mass or acute finding within the imaged orbits. Complete opacification of the right maxillary sinus. Mild mucosal thickening scattered within the mild mucosal thickening, and possible fluid, scattered within the bilateral ethmoid air cells. Review of the MIP images confirms the  above findings CTA NECK FINDINGS Aortic arch: The origins of the innominate and left common carotid arteries are excluded from the field of view. Atherosclerotic plaque within the visualized aortic arch and proximal major branch vessels of the neck. Within described limitations, there is no appreciable hemodynamically significant stenosis within the innominate or proximal subclavian arteries. Right carotid system: CCA and ICA patent within the neck without stenosis. Mild atherosclerotic plaque within the CCA and cervical ICA. Left carotid system: The origin of the common carotid artery is excluded from the field of view. Within this limitation, the CCA and ICA are patent within the neck without stenosis. Mild atherosclerotic plaque within the CCA and carotid bifurcation. Vertebral arteries: Codominant and patent within the neck without stenosis. Minimal nonstenotic atherosclerotic plaque within the left V2 segment. Skeleton: Reversal of the expected cervical lordosis. Slight C4-C5 and C5-C6 grade 1 anterolisthesis. Cervical spondylosis. Most notably, advanced disc space narrowing is present at C5-C6, C6-C7 and C7-T1. Facet joint ankylosis on the left at C3-C4. Other neck: No neck mass or cervical lymphadenopathy. Upper chest: No consolidation within the imaged lung apices. Review of the MIP images confirms the above findings CTA HEAD FINDINGS Anterior circulation: The intracranial internal carotid arteries are patent. Atherosclerotic plaque within both vessels with with no more than mild stenosis. The M1 middle cerebral arteries are patent. Atherosclerotic irregularity of the M2 and more distal MCA vessels, bilaterally. No M2 proximal branch occlusion or high-grade proximal stenosis. The anterior cerebral arteries are patent. No intracranial aneurysm is identified. Posterior circulation: The intracranial vertebral arteries are patent. Nonstenotic atherosclerotic plaque within both vessels. The basilar artery is  patent. The posterior cerebral arteries are patent. Atherosclerotic irregularity of the right PCA. Most notably, there is a moderate focal stenosis within the proximal right P2 segment (series 11, image 22) (  series 15, image 13). A small left posterior communicating artery is present. The right posterior communicating artery is diminutive or absent. Venous sinuses: Within the limitations of contrast timing, no convincing thrombus. Anatomic variants: As described. Review of the MIP images confirms the above findings IMPRESSION: CT head: 1. No evidence of acute intracranial hemorrhage, acute cortical infarct or intracranial mass. 2. Tiny age-indeterminate lacunar infarct versus prominent perivascular space within the anterior limb of left internal capsule. A brain MRI may be obtained for further evaluation, as clinically warranted. 3. Small chronic cortically-based infarct within the lateral right occipital lobe (PCA territory). 4. Mild chronic small vessel ischemic changes within the cerebral white matter. 5. Mild generalized cerebral atrophy. 6. Paranasal sinus disease, as described. CTA neck: 1. The origin of the left common carotid artery is excluded from the field of view. Within this limitation, the common carotid and internal carotid arteries are patent within the neck without stenosis. Mild atherosclerotic plaque, bilaterally. 2. The vertebral arteries are patent within the neck without stenosis. Minimal non-stenotic atherosclerotic plaque within the left V2 segment. 3. Please note, the origin of the innominate artery is also excluded from the field of view. 4. Aortic Atherosclerosis (ICD10-I70.0). 5. Cervical spondylosis, as described. CTA head: 1. No intracranial large vessel occlusion. 2. Intracranial atherosclerotic disease, as described. Most notably, there is a moderate focal stenosis within the right PCA P2 segment. Electronically Signed   By: Jackey LogeKyle  Golden D.O.   On: 05/04/2022 10:50     Assessment  and Recommendation  88 y.o. female with acute stroke with neurologic deficit and paroxysmal nonvalvular atrial fibrillation now spontaneously converted to normal sinus rhythm and improving neurologic deficits needing continued medication management 1.  No further intervention or other cardiac diagnostics necessary at this time due to normal LV systolic function by echocardiogram 2.  No additional treatment of atrial fibrillation at this time due to patient maintaining normal sinus rhythm 3.  Anticoagulation with enoxaparin transitioning to Eliquis at 3 to 5 days as per neurology 4.  Call if further questions  Signed, Arnoldo HookerBruce Jeri Jeanbaptiste M.D. FACC

## 2022-05-06 NOTE — Assessment & Plan Note (Signed)
Rate is well-controlled for now and in normal sinus rhythm.  Considering her CHA2DS2-VASc score of 6 (age, htn, sex, cva) she will need anti-coagulation. Echo within normal limits.  Cardiology following Neurology to decide on timing of heparin and Eliquis

## 2022-05-06 NOTE — Assessment & Plan Note (Signed)
Continue as needed hydralazine and Lopressor

## 2022-05-06 NOTE — Assessment & Plan Note (Signed)
Patient takes torsemide at home.  This can be resumed at discharge

## 2022-05-06 NOTE — Assessment & Plan Note (Signed)
Continue Lipitor and fenofibrate.

## 2022-05-06 NOTE — Assessment & Plan Note (Signed)
Permissive hypertension with goal blood pressure less than 220/110 for first 48 hours Patient could not get MRI but based on her presentation with right upper extremity weakness, right facial droop, dysarthria she likely has stroke.  Neurology recommends monitoring in house.  Based on the clinical improvement anticoagulation to be decided.  First with heparin drip and then transition to Eliquis at day 3-5 PT, OT, ST recommends home health Patient did not tolerate Plavix as she developed facial flushing and after administration so that has been held.  She also has a listed allergy to aspirin. Continue Lipitor and fenofibrate Neurology following Repeat CT head on 11/22 showed no acute changes

## 2022-05-06 NOTE — Progress Notes (Signed)
  Progress Note   Patient: Kelly Hawkins OZD:[id number] DOB: Apr 19, 1927 DOA: 05/04/2022     1 DOS: the patient was seen and examined on 05/06/2022   Brief hospital course: 88 year old female with a known history of hypertension, hyperlipidemia, COPD, myelitis, bilateral lower extremity edema is admitted for suspected stroke  11/22-23: Neurology recommends monitoring in-house and starting anticoagulation between days 3-5.  First with heparin drip then transition to Eliquis.   Assessment and Plan: * Stroke (HCC) Permissive hypertension with goal blood pressure less than 220/110 for first 48 hours Patient could not get MRI but based on her presentation with right upper extremity weakness, right facial droop, dysarthria she likely has stroke.  Neurology recommends monitoring in house.  Based on the clinical improvement anticoagulation to be decided.  First with heparin drip and then transition to Eliquis at day 3-5 PT, OT, ST recommends home health Patient did not tolerate Plavix as she developed facial flushing and after administration so that has been held.  She also has a listed allergy to aspirin. Continue Lipitor and fenofibrate Neurology following Repeat CT head on 11/22 showed no acute changes  HTN (hypertension) Continue as needed hydralazine and Lopressor  New onset atrial fibrillation (HCC) Rate is well-controlled for now and in normal sinus rhythm.  Considering her CHA2DS2-VASc score of 6 (age, htn, sex, cva) she will need anti-coagulation. Echo within normal limits.  Cardiology following Neurology to decide on timing of heparin and Eliquis  HLD (hyperlipidemia) Continue Lipitor and fenofibrate  Bilateral leg edema Patient takes torsemide at home.  This can be resumed at discharge        Subjective: Difficulty hearing, slept well overnight.  Physical Exam: Vitals:   05/05/22 2050 05/06/22 0602 05/06/22 0737 05/06/22 0739  BP: (!) 141/81 (!) 150/77 (!) 158/86 (!)  147/80  Pulse: 86 92 95 92  Resp: 15 16 16 16   Temp: 97.7 F (36.5 C) 98.3 F (36.8 C) 98.3 F (36.8 C)   TempSrc:      SpO2: 96% 96% 95% 97%  Weight:      Height:       88 year old female lying in the bed comfortably without any acute distress Lungs clear to auscultation bilaterally Cardiovascular regular rate and rhythm Abdomen soft, benign Neuro alert and awake, right-sided weaker than left.  Upper extremity weaker than lower Data Reviewed:  Potassium 3.4  Family Communication: None  Disposition: Status is: Inpatient Remains inpatient appropriate because: Management of stroke and anticoagulation decision/timing per neurology  Planned Discharge Destination: Home with Home Health   DVT prophylaxis-Lovenox Time spent: 35 minutes  Author: 83, MD 05/06/2022 1:37 PM  For on call review [email or web address].

## 2022-05-07 DIAGNOSIS — I639 Cerebral infarction, unspecified: Secondary | ICD-10-CM | POA: Diagnosis not present

## 2022-05-07 DIAGNOSIS — Z7189 Other specified counseling: Secondary | ICD-10-CM | POA: Diagnosis not present

## 2022-05-07 DIAGNOSIS — I4891 Unspecified atrial fibrillation: Secondary | ICD-10-CM | POA: Diagnosis not present

## 2022-05-07 DIAGNOSIS — Z515 Encounter for palliative care: Secondary | ICD-10-CM

## 2022-05-07 DIAGNOSIS — J449 Chronic obstructive pulmonary disease, unspecified: Secondary | ICD-10-CM | POA: Diagnosis not present

## 2022-05-07 DIAGNOSIS — L8990 Pressure ulcer of unspecified site, unspecified stage: Secondary | ICD-10-CM | POA: Insufficient documentation

## 2022-05-07 MED ORDER — GLYCOPYRROLATE 0.2 MG/ML IJ SOLN: 0.2000 mg | INTRAMUSCULAR | Status: DC | PRN

## 2022-05-07 MED ORDER — POLYVINYL ALCOHOL 1.4 % OP SOLN: 1.0000 [drp] | Freq: Four times a day (QID) | OPHTHALMIC | Status: DC | PRN

## 2022-05-07 MED ORDER — LORAZEPAM 2 MG/ML IJ SOLN: 0.5000 mg | INTRAMUSCULAR | Status: DC | PRN

## 2022-05-07 MED ORDER — GLYCOPYRROLATE 1 MG PO TABS: 1.0000 mg | ORAL_TABLET | ORAL | Status: DC | PRN

## 2022-05-07 MED ORDER — MORPHINE SULFATE (PF) 2 MG/ML IV SOLN: 1.0000 mg | INTRAVENOUS | Status: DC | PRN

## 2022-05-07 MED ORDER — ACETAMINOPHEN 650 MG RE SUPP: 650.0000 mg | Freq: Four times a day (QID) | RECTAL | Status: DC | PRN

## 2022-05-07 MED ORDER — HALOPERIDOL LACTATE 5 MG/ML IJ SOLN: 1.0000 mg | Freq: Four times a day (QID) | INTRAMUSCULAR | Status: DC | PRN

## 2022-05-07 MED ORDER — ACETAMINOPHEN 325 MG PO TABS: 650.0000 mg | ORAL_TABLET | Freq: Four times a day (QID) | ORAL | Status: DC | PRN

## 2022-05-07 NOTE — Assessment & Plan Note (Signed)
Comfort care now °

## 2022-05-07 NOTE — Progress Notes (Signed)
OT Cancellation Note  Patient Details Name: OSHA RANE MRN: [id number] DOB: May 28, 1927   Cancelled Treatment:    Reason Eval/Treat Not Completed: Other (comment). Pt noted to be comfort care now, planning for hospice tomorrow. Will sign off for OT. Please re-consult if appropriate.   Arman Filter., MPH, MS, OTR/L ascom [phone] 05/07/22, 4:31 PM

## 2022-05-07 NOTE — Care Management Important Message (Signed)
Important Message  Patient Details  Name: Kelly Hawkins MRN: [id number] Date of Birth: [birth year]   Medicare Important Message Given:  N/A - LOS <3 / Initial given by admissions     Johnell Comings 05/07/2022, 8:31 AM

## 2022-05-07 NOTE — TOC Progression Note (Signed)
Transition of Care [facility]) - Progression Note    Patient Details  Name: Kelly Hawkins MRN: [id number] Date of Birth: 1927-06-05  Transition of Care [facility]) CM/SW Contact  Tempie Hoist, [place] Phone Number: 05/07/2022, 3:07 PM  Clinical Narrative:     TOC spoke to the hospice liaison. The patient will be evaluated for home hospice services.  Expected Discharge Plan: Home w Home Health Services    Expected Discharge Plan and Services Expected Discharge Plan: Home w Home Health Services                                               Social Determinants of Health (SDOH) Interventions    Readmission Risk Interventions     No data to display

## 2022-05-07 NOTE — Progress Notes (Signed)
[facility]) Hospital Liaison Note  Received request from Dr. Sherryll Burger and Tempie Hoist with [facility] for hospice services at home after discharge. Spoke with patient and patients daughter Caroline Sauger at bedside to initiate education related to hospice philosophy, services and team approach to care. Daughter verbalized understanding. Per discussion patient will discharge home by private car when medically cleared.  No DME requested at this time.  The correct address the patient will be discharging to is:  [address] [place], [place] [address] Caroline Sauger (daughter)  [phone]  Please send completed and signed DNR with patient at time of discharge.  Please provide prescriptions at discharge as needed to ensure ongoing symptom management.  Please call with any hospice related questions.  Thank you for the opportunity to participate in this patients care.  Haynes Bast, BSN, RN [facility] Liaison [phone]

## 2022-05-07 NOTE — IPAL (Signed)
  Interdisciplinary Goals of Care Family Meeting   Date carried out: 05/07/2022  Location of Kelly meeting: Bedside  Member's involved: Physician and Family Member or next of kin  Durable Power of Attorney or Environmental health practitioner: son and caregiver    Discussion: We discussed goals of care for Kelly Hawkins   Code status: Full DNR  Disposition: Home with Hospice  Time spent for Kelly meeting: 35 mins    Delfino Lovett, MD  05/07/2022, 9:49 AM

## 2022-05-07 NOTE — Plan of Care (Signed)
Per Dr. Sherryll Burger, family has decided to transition to comfort care and take her home with hospice. Neurology to sign off, but please re-engage if any additional neurologic concerns arise.  Bing Neighbors, MD [facility] [phone]  If 7pm- 7am, please page neurology on call as listed in AMION.

## 2022-05-07 NOTE — Progress Notes (Signed)
Physical Therapy Treatment Patient Details Name: Kelly Hawkins MRN: [id number] DOB: 1927-06-08 Today's Date: 05/07/2022   History of Present Illness Pt admitted to [facility] on 05/04/22 for c/o stroke like symptoms including: RUE weakness, facial droop, and word finding difficulties. Symptoms resolved by the time she came to the ED. Per Dr. Jacelyn Grip this is not a code stroke, but requested CT imaging. Significant PMH includes: COPD, HTN, and chronic pain.    PT Comments    Pt is making slow progress towards goals. Still demonstrates R UE<L UE. B LE appears grossly WNL. Able to ambulate improved gait distance, similar to household distance in home environment. Educated to continue use of RW for all mobility. All needs met upon leaving room. Will continue to progress.   Recommendations for follow up therapy are one component of a multi-disciplinary discharge planning process, led by the attending physician.  Recommendations may be updated based on patient status, additional functional criteria and insurance authorization.  Follow Up Recommendations  Home health PT     Assistance Recommended at Discharge Intermittent Supervision/Assistance  Patient can return home with the following A little help with walking and/or transfers;A little help with bathing/dressing/bathroom;Assistance with cooking/housework;Direct supervision/assist for medications management;Direct supervision/assist for financial management;Assist for transportation;Help with stairs or ramp for entrance   Equipment Recommendations  BSC/3in1    Recommendations for Other Services       Precautions / Restrictions Precautions Precautions: Fall Restrictions Weight Bearing Restrictions: No     Mobility  Bed Mobility Overal bed mobility: Needs Assistance Bed Mobility: Supine to Sit     Supine to sit: Min guard Sit to supine: Min assist   General bed mobility comments: reaches for therapist hand this date to assist with trunkal  elevation. Once seated, R UE appears slightly weaker. Decreased ability to demonstrate above head movement. UPright posture noted. Once back in bed, needs help with repositioning    Transfers Overall transfer level: Needs assistance Equipment used: Rolling walker (2 wheels) Transfers: Sit to/from Stand Sit to Stand: Min guard           General transfer comment: RW used with slight improvement in posture this date.    Ambulation/Gait Ambulation/Gait assistance: Min guard Gait Distance (Feet): 80 Feet Assistive device: Rolling walker (2 wheels) Gait Pattern/deviations: Step-through pattern       General Gait Details: improved gait tolerance with ability to ambulate in hallway this date using RW. Slow speed. Follows commands well   Marine scientist Rankin (Stroke Patients Only)       Balance Overall balance assessment: Needs assistance Sitting-balance support: Bilateral upper extremity supported Sitting balance-Leahy Scale: Good     Standing balance support: During functional activity, No upper extremity supported Standing balance-Leahy Scale: Good                              Cognition Arousal/Alertness: Awake/alert Behavior During Therapy: WFL for tasks assessed/performed Overall Cognitive Status: Within Functional Limits for tasks assessed                                 General Comments: HOH, pleasant and agreeable to session        Exercises      General Comments        Pertinent Vitals/Pain Pain  Assessment Pain Assessment: No/denies pain    Home Living                          Prior Function            PT Goals (current goals can now be found in the care plan section) Acute Rehab PT Goals Patient Stated Goal: "go home" PT Goal Formulation: With patient/family Time For Goal Achievement: 05/18/22 Potential to Achieve Goals: Good Progress towards PT goals:  Progressing toward goals    Frequency    Min 2X/week      PT Plan Current plan remains appropriate    Co-evaluation              AM-PAC PT "6 Clicks" Mobility   Outcome Measure  Help needed turning from your back to your side while in a flat bed without using bedrails?: None Help needed moving from lying on your back to sitting on the side of a flat bed without using bedrails?: A Little Help needed moving to and from a bed to a chair (including a wheelchair)?: A Little Help needed standing up from a chair using your arms (e.g., wheelchair or bedside chair)?: A Little Help needed to walk in hospital room?: A Little Help needed climbing 3-5 steps with a railing? : A Little 6 Click Score: 19    End of Session Equipment Utilized During Treatment: Gait belt Activity Tolerance: Patient tolerated treatment well;Patient limited by fatigue Patient left: in bed;with call bell/phone within reach;with family/visitor present Nurse Communication: Mobility status PT Visit Diagnosis: Unsteadiness on feet (R26.81);Muscle weakness (generalized) (M62.81);Difficulty in walking, not elsewhere classified (R26.2)     Time: 4327-6147 PT Time Calculation (min) (ACUTE ONLY): 17 min  Charges:  $Gait Training: 8-22 mins                     Greggory Stallion, PT, DPT, GCS [phone]    Kelly Hawkins 05/07/2022, 10:35 AM

## 2022-05-07 NOTE — Progress Notes (Signed)
  Progress Note   Patient: Kelly Hawkins ION:[id number] DOB: 1927-03-09 DOA: 05/04/2022     2 DOS: the patient was seen and examined on 05/07/2022   Brief hospital course: 88 year old female with a known history of hypertension, hyperlipidemia, COPD, myelitis, bilateral lower extremity edema is admitted for suspected stroke  11/22-23: Neurology recommends monitoring in-house and starting anticoagulation between days 3-5.  First with heparin drip then transition to Eliquis. 11/24: Hospice eval  Assessment and Plan: * Stroke [facility]) Comfort care now  HTN (hypertension) Comfort care now  New onset atrial fibrillation (HCC) Comfort care now  HLD (hyperlipidemia) Continue Lipitor and fenofibrate  Bilateral leg edema Patient takes torsemide at home.  This can be resumed at discharge  Hospice care Plan for hospice at home tomorrow        Subjective: Family wanting to have her evaluated for hospice and keep her comfortable  Physical Exam: Vitals:   05/06/22 1546 05/06/22 1959 05/07/22 0044 05/07/22 0812  BP: (!) 141/74 137/63 (!) 152/75 139/82  Pulse: 84 88 92 82  Resp: 16 17 17 18   Temp: 98.7 F (37.1 C) (!) 97.5 F (36.4 C) 98.3 F (36.8 C) 98 F (36.7 C)  TempSrc:  Oral Oral   SpO2: 98% 96% 96% 96%  Weight:      Height:       88 year old female lying in the bed comfortably without any acute distress Lungs clear to auscultation bilaterally Cardiovascular regular rate and rhythm Abdomen soft, benign Neuro alert and awake, right-sided weaker than left.  Upper extremity weaker than lower Data Reviewed:  There are no new results to review at this time.  Family Communication: Son and caregiver at bedside updated  Disposition: Status is: Inpatient Remains inpatient appropriate because: Hospice screening  Planned Discharge Destination:  Home with hospice tomorrow   DVT prophylaxis-comfort care Time spent: 25 minutes  Author: 83, MD 05/07/2022 4:21  PM  For on call review [email or web address]/[email or web address].

## 2022-05-07 NOTE — Assessment & Plan Note (Signed)
Plan for hospice at home tomorrow

## 2022-05-08 DIAGNOSIS — Z515 Encounter for palliative care: Secondary | ICD-10-CM | POA: Diagnosis not present

## 2022-05-08 DIAGNOSIS — J449 Chronic obstructive pulmonary disease, unspecified: Secondary | ICD-10-CM | POA: Diagnosis not present

## 2022-05-08 DIAGNOSIS — I4891 Unspecified atrial fibrillation: Secondary | ICD-10-CM | POA: Diagnosis not present

## 2022-05-08 DIAGNOSIS — I639 Cerebral infarction, unspecified: Secondary | ICD-10-CM | POA: Diagnosis not present

## 2022-05-08 NOTE — Progress Notes (Signed)
Assisted patient with getting dressed. PIV removed. Daughter and son bringing patient home. DNR form given to family. No further needs.

## 2022-05-08 NOTE — Discharge Summary (Signed)
Physician Discharge Summary   Patient: Kelly Hawkins MRN: [id number] DOB: 1927/01/17  Admit date:     05/04/2022  Discharge date: 05/08/22  Discharge Physician: Delfino Lovett   PCP: Pcp, No   Recommendations at discharge:   Hospice at home  Discharge Diagnoses: Principal Problem:   Stroke [facility]) Active Problems:   HTN (hypertension)   COPD (chronic obstructive pulmonary disease) (HCC)   New onset atrial fibrillation (HCC)   HLD (hyperlipidemia)   Bilateral leg edema   TIA (transient ischemic attack)   Hospice care   Goals of care, counseling/discussion   Pressure injury of skin  Resolved Problems:   * No resolved hospital problems. [facility] Course: 88 year old female with a known history of hypertension, hyperlipidemia, COPD, myelitis, bilateral lower extremity edema is admitted for suspected stroke  11/22-23: Neurology recommends monitoring in-house and starting anticoagulation between days 3-5.  First with heparin drip then transition to Eliquis. 11/24: Hospice eval  Assessment and Plan: * Stroke [facility]) Comfort care now  HTN (hypertension) Comfort care now  New onset atrial fibrillation (HCC) Comfort care now  HLD (hyperlipidemia) Continue Lipitor and fenofibrate  Bilateral leg edema Patient takes torsemide at home.  This can be resumed at discharge  Hospice care Plan for hospice at home tomorrow         Consultants: Neurology Disposition: Hospice care Diet recommendation:  Discharge Diet Orders (From admission, onward)     Start     Ordered   05/08/22 0000  Diet - low sodium heart healthy        05/08/22 0916           Carb modified diet DISCHARGE MEDICATION: Allergies as of 05/08/2022       Reactions   Ace Inhibitors    Other reaction(s): Unknown   Alendronate    Other reaction(s): Unknown   Aspirin    Other reaction(s): Unknown   Cefuroxime Axetil    Other reaction(s): Unknown   Celecoxib    Other reaction(s): Unknown    Ciprofloxacin    Other reaction(s): Unknown   Cromolyn    Other reaction(s): Unknown   Estrogens    Other reaction(s): Unknown   Ibuprofen    Other reaction(s): Unknown   Naproxen Sodium Hives   Plavix [clopidogrel]    Facial flushing   Sulfamethoxazole-trimethoprim    Other reaction(s): Unknown        Medication List     STOP taking these medications    Pfizer COVID-19 Vac Bivalent injection Generic drug: COVID-19 mRNA bivalent vaccine Proofreader)   Pfizer-BioNT COVID-19 Vac-TriS Susp injection Generic drug: COVID-19 mRNA Vac-TriS Proofreader)       TAKE these medications    albuterol 108 (90 Base) MCG/ACT inhaler Commonly known as: VENTOLIN HFA Inhale 2 puffs into the lungs every 6 (six) hours as needed.   cyanocobalamin 1000 MCG tablet Commonly known as: VITAMIN B12 Take 1,000 mcg by mouth daily.   D 1000 25 MCG (1000 UT) capsule Generic drug: Cholecalciferol Take 2,000 Units by mouth daily.   fenofibrate 160 MG tablet Take 160 mg by mouth daily.   fluticasone 50 MCG/ACT nasal spray Commonly known as: FLONASE Place 2 sprays into both nostrils daily as needed.   losartan 50 MG tablet Commonly known as: COZAAR Take 1 tablet by mouth daily.   montelukast 10 MG tablet Commonly known as: SINGULAIR Take 10 mg by mouth at bedtime.   raloxifene 60 MG tablet Commonly known as: EVISTA Take 60 mg by  mouth daily.   torsemide 5 MG tablet Commonly known as: DEMADEX Take 15 mg by mouth daily.   traMADol 50 MG tablet Commonly known as: ULTRAM Take 50 mg by mouth 3 (three) times daily as needed.        Follow-up Information     Gerlene Fee, PA-C. Go in 1 week(s).   Specialty: Cardiology Contact information: [address] [place] [address] [phone]                Discharge Exam: Ceasar Mons Weights   05/04/22 4696  Weight: 26 kg   88 year old female lying in the bed comfortably without any acute distress Lungs clear to  auscultation bilaterally Cardiovascular regular rate and rhythm Abdomen soft, benign Neuro alert and awake, right-sided weaker than left.  Upper extremity weaker than lower  Condition at discharge: poor  The results of significant diagnostics from this hospitalization (including imaging, microbiology, ancillary and laboratory) are listed below for reference.   Imaging Studies: CT HEAD WO CONTRAST ( )  Result Date: 05/06/2022 CLINICAL DATA:  Follow-up stroke, worsening dysarthria EXAM: CT HEAD WITHOUT CONTRAST TECHNIQUE: Contiguous axial images were obtained from the base of the skull through the vertex without intravenous contrast. RADIATION DOSE REDUCTION: This exam was performed according to the departmental dose-optimization program which includes automated exposure control, adjustment of the mA and/or kV according to patient size and/or use of iterative reconstruction technique. COMPARISON:  05/05/2022 FINDINGS: Brain: No evidence of acute infarction, hemorrhage, hydrocephalus, extra-axial collection or mass lesion/mass effect. Periventricular and deep white matter hypodensity. Unchanged focal encephalomalacia of the right occipital lobe (series 3, image 18) Vascular: No hyperdense vessel or unexpected calcification. Skull: Normal. Negative for fracture or focal lesion. Sinuses/Orbits: No acute finding. Other: None. IMPRESSION: 1. No acute intracranial pathology. Small-vessel white matter disease and unchanged focal encephalomalacia of the right occipital lobe. 2. MRI may be used to more sensitively detect acute diffusion restricting infarction if desired. Electronically Signed   By: Jearld Lesch M.D.   On: 05/06/2022 15:15   ECHOCARDIOGRAM COMPLETE  Result Date: 05/05/2022    ECHOCARDIOGRAM REPORT   Patient Name:   Kelly Hawkins Date of Exam: 05/04/2022 Medical Rec #:  [id number]    Height:       55.9 in Accession #:    [id number]   Weight:       108.0 lb Date of Birth:  [birth year]     BSA:           1.365 m Patient Age:    88 years     BP:           149/82 mmHg Patient Gender: F            HR:           99 bpm. Exam Location:  [facility] Procedure: 2D Echo, Cardiac Doppler and Color Doppler Indications:     I63.9 Stroke  History:         Patient has no prior history of Echocardiogram examinations.                  COPD; Risk Factors:Hypertension and Dyslipidemia.  Sonographer:     Daphine Deutscher RDCS Referring Phys:  [id number] Cheryln Manly TANG Diagnosing Phys: Arnoldo Hooker MD IMPRESSIONS  1. Left ventricular ejection fraction, by estimation, is 65 to 70%. The left ventricle has normal function. The left ventricle has no regional wall motion abnormalities. Left ventricular diastolic parameters were normal.  2. Right ventricular  systolic function is normal. The right ventricular size is normal.  3. The mitral valve is normal in structure. Mild mitral valve regurgitation.  4. The aortic valve is normal in structure. Aortic valve regurgitation is not visualized. FINDINGS  Left Ventricle: Left ventricular ejection fraction, by estimation, is 65 to 70%. The left ventricle has normal function. The left ventricle has no regional wall motion abnormalities. The left ventricular internal cavity size was normal in size. There is  no left ventricular hypertrophy. Left ventricular diastolic parameters were normal. Right Ventricle: The right ventricular size is normal. No increase in right ventricular wall thickness. Right ventricular systolic function is normal. Left Atrium: Left atrial size was normal in size. Right Atrium: Right atrial size was normal in size. Pericardium: There is no evidence of pericardial effusion. Mitral Valve: The mitral valve is normal in structure. Mild mitral valve regurgitation. Tricuspid Valve: The tricuspid valve is normal in structure. Tricuspid valve regurgitation is mild. Aortic Valve: The aortic valve is normal in structure. Aortic valve regurgitation is not visualized. Pulmonic  Valve: The pulmonic valve was normal in structure. Pulmonic valve regurgitation is trivial. Aorta: The aortic root and ascending aorta are structurally normal, with no evidence of dilitation. IAS/Shunts: No atrial level shunt detected by color flow Doppler.  LEFT VENTRICLE PLAX 2D LVIDd:         3.90 cm   Diastology LVIDs:         2.10 cm   LV e' medial:    6.22 cm/s LV PW:         1.00 cm   LV E/e' medial:  10.5 LV IVS:        1.00 cm   LV e' lateral:   9.01 cm/s LVOT diam:     1.90 cm   LV E/e' lateral: 7.3 LV SV:         50 LV SV Index:   37 LVOT Area:     2.84 cm  RIGHT VENTRICLE             IVC RV Basal diam:  2.60 cm     IVC diam: 1.00 cm RV S prime:     18.47 cm/s TAPSE (M-mode): 2.1 cm LEFT ATRIUM             Index        RIGHT ATRIUM           Index LA diam:        3.80 cm 2.78 cm/m   RA Area:     14.90 cm LA Vol (A2C):   37.5 ml 27.48 ml/m  RA Volume:   40.60 ml  29.75 ml/m LA Vol (A4C):   34.1 ml 24.99 ml/m LA Biplane Vol: 37.6 ml 27.55 ml/m  AORTIC VALVE LVOT Vmax:   114.00 cm/s LVOT Vmean:  76.433 cm/s LVOT VTI:    0.178 m  AORTA Ao Root diam: 3.20 cm Ao Asc diam:  3.60 cm MITRAL VALVE MV Area (PHT): 2.55 cm     SHUNTS MV Decel Time: 298 msec     Systemic VTI:  0.18 m MV E velocity: 65.50 cm/s   Systemic Diam: 1.90 cm MV A velocity: 115.50 cm/s MV E/A ratio:  0.57 Arnoldo Hooker MD Electronically signed by Arnoldo Hooker MD Signature Date/Time: 05/05/2022/3:22:05 PM    Final    CT HEAD WO CONTRAST ( )  Result Date: 05/05/2022 CLINICAL DATA:  Neuro deficit, acute, stroke suspected EXAM: CT HEAD WITHOUT CONTRAST TECHNIQUE: Contiguous axial images  were obtained from the base of the skull through the vertex without intravenous contrast. RADIATION DOSE REDUCTION: This exam was performed according to the departmental dose-optimization program which includes automated exposure control, adjustment of the mA and/or kV according to patient size and/or use of iterative reconstruction technique.  COMPARISON:  CT head May 04, 2022. FINDINGS: Brain: Similar chronic appearing cortical/subcortical infarct in the right occipital lobe. No evidence of acute large vascular territory infarct, acute hemorrhage, mass lesion or midline shift. Vascular: No hyperdense vessel identified. Skull: No acute fracture. Sinuses/Orbits: Opacified right maxillary sinus, partially imaged. No acute orbital findings. Other: No mastoid effusions. IMPRESSION: 1. No definite evidence of acute intracranial abnormality. 2. Similar chronic appearing cortical/subcortical infarct in the right occipital lobe. An MRI could provide more sensitive evaluation for acute infarct if the patient is able. Electronically Signed   By: Feliberto HartsFrederick S Jones M.D.   On: 05/05/2022 10:32   US Venous Img Lower Bilateral (DVT)  Result Date: 05/04/2022 CLINICAL DATA:  Leg edema in a 88 year old female. EXAM: Bilateral LOWER EXTREMITY VENOUS DOPPLER ULTRASOUND TECHNIQUE: Gray-scale sonography with compression, as well as color and duplex ultrasound, were performed to evaluate the deep venous system(s) from the level of the common femoral vein through the popliteal and proximal calf veins. COMPARISON:  None available FINDINGS: VENOUS Normal compressibility of the common femoral, superficial femoral, and popliteal veins, as well as the visualized calf veins. Visualized portions of profunda femoral vein and great saphenous vein unremarkable. No filling defects to suggest DVT on grayscale or color Doppler imaging. Doppler waveforms show normal direction of venous flow, normal respiratory plasticity and response to augmentation. OTHER 4.3 x 1.4 x 2.6 cm Baker's cyst in the LEFT popliteal fossa. Signs of edema in the lower leg in particular in the subcutaneous tissues. This may be slightly greater on the LEFT than the RIGHT. Limitations: None. IMPRESSION: Negative for deep venous thrombosis in the LEFT or RIGHT lower extremity with signs of lower extremity edema  and LEFT popliteal Baker's cyst. Electronically Signed   By: Donzetta KohutGeoffrey  Wile M.D.   On: 05/04/2022 13:27   CT ANGIO HEAD NECK W WO CM  Result Date: 05/04/2022 CLINICAL DATA:  Provided history: Neuro deficit, acute, stroke suspected. Additional history provided: Right facial droop, leaning to the right, reported symptoms resolved upon EMS arrival. EXAM: CT ANGIOGRAPHY HEAD AND NECK TECHNIQUE: Multidetector CT imaging of the head and neck was performed using the standard protocol during bolus administration of intravenous contrast. Multiplanar CT image reconstructions and MIPs were obtained to evaluate the vascular anatomy. Carotid stenosis measurements (when applicable) are obtained utilizing NASCET criteria, using the distal internal carotid diameter as the denominator. RADIATION DOSE REDUCTION: This exam was performed according to the departmental dose-optimization program which includes automated exposure control, adjustment of the mA and/or kV according to patient size and/or use of iterative reconstruction technique. CONTRAST:  50mL OMNIPAQUE IOHEXOL 350 MG/ML SOLN COMPARISON:  No pertinent prior exams available for comparison. FINDINGS: CT HEAD FINDINGS Brain: Mild generalized cerebral atrophy. Small chronic appearing cortical/subcortical infarct within the right occipital lobe (right PCA territory) (for instance as seen on series 3, image 14). Mild patchy and ill-defined hypoattenuation within the cerebral white matter. There is a small hypodense focus within the anterior limb of left internal capsule, which may reflect a prominent perivascular space or tiny age-indeterminate lacunar infarct (best appreciated on series 6, image 23). There is no acute intracranial hemorrhage. No demarcated cortical infarct. No extra-axial fluid collection. No evidence of  an intracranial mass. No midline shift. Vascular: No hyperdense vessel.  Atherosclerotic calcifications. Skull: No fracture or aggressive osseous lesion.  Sinuses/Orbits: No mass or acute finding within the imaged orbits. Complete opacification of the right maxillary sinus. Mild mucosal thickening scattered within the mild mucosal thickening, and possible fluid, scattered within the bilateral ethmoid air cells. Review of the MIP images confirms the above findings CTA NECK FINDINGS Aortic arch: The origins of the innominate and left common carotid arteries are excluded from the field of view. Atherosclerotic plaque within the visualized aortic arch and proximal major branch vessels of the neck. Within described limitations, there is no appreciable hemodynamically significant stenosis within the innominate or proximal subclavian arteries. Right carotid system: CCA and ICA patent within the neck without stenosis. Mild atherosclerotic plaque within the CCA and cervical ICA. Left carotid system: The origin of the common carotid artery is excluded from the field of view. Within this limitation, the CCA and ICA are patent within the neck without stenosis. Mild atherosclerotic plaque within the CCA and carotid bifurcation. Vertebral arteries: Codominant and patent within the neck without stenosis. Minimal nonstenotic atherosclerotic plaque within the left V2 segment. Skeleton: Reversal of the expected cervical lordosis. Slight C4-C5 and C5-C6 grade 1 anterolisthesis. Cervical spondylosis. Most notably, advanced disc space narrowing is present at C5-C6, C6-C7 and C7-T1. Facet joint ankylosis on the left at C3-C4. Other neck: No neck mass or cervical lymphadenopathy. Upper chest: No consolidation within the imaged lung apices. Review of the MIP images confirms the above findings CTA HEAD FINDINGS Anterior circulation: The intracranial internal carotid arteries are patent. Atherosclerotic plaque within both vessels with with no more than mild stenosis. The M1 middle cerebral arteries are patent. Atherosclerotic irregularity of the M2 and more distal MCA vessels, bilaterally. No  M2 proximal branch occlusion or high-grade proximal stenosis. The anterior cerebral arteries are patent. No intracranial aneurysm is identified. Posterior circulation: The intracranial vertebral arteries are patent. Nonstenotic atherosclerotic plaque within both vessels. The basilar artery is patent. The posterior cerebral arteries are patent. Atherosclerotic irregularity of the right PCA. Most notably, there is a moderate focal stenosis within the proximal right P2 segment (series 11, image 22) (series 15, image 13). A small left posterior communicating artery is present. The right posterior communicating artery is diminutive or absent. Venous sinuses: Within the limitations of contrast timing, no convincing thrombus. Anatomic variants: As described. Review of the MIP images confirms the above findings IMPRESSION: CT head: 1. No evidence of acute intracranial hemorrhage, acute cortical infarct or intracranial mass. 2. Tiny age-indeterminate lacunar infarct versus prominent perivascular space within the anterior limb of left internal capsule. A brain MRI may be obtained for further evaluation, as clinically warranted. 3. Small chronic cortically-based infarct within the lateral right occipital lobe (PCA territory). 4. Mild chronic small vessel ischemic changes within the cerebral white matter. 5. Mild generalized cerebral atrophy. 6. Paranasal sinus disease, as described. CTA neck: 1. The origin of the left common carotid artery is excluded from the field of view. Within this limitation, the common carotid and internal carotid arteries are patent within the neck without stenosis. Mild atherosclerotic plaque, bilaterally. 2. The vertebral arteries are patent within the neck without stenosis. Minimal non-stenotic atherosclerotic plaque within the left V2 segment. 3. Please note, the origin of the innominate artery is also excluded from the field of view. 4. Aortic Atherosclerosis (ICD10-I70.0). 5. Cervical  spondylosis, as described. CTA head: 1. No intracranial large vessel occlusion. 2. Intracranial atherosclerotic disease, as  described. Most notably, there is a moderate focal stenosis within the right PCA P2 segment. Electronically Signed   By: Jackey Loge D.O.   On: 05/04/2022 10:50    Microbiology: No results found for this or any previous visit.  Labs: CBC: Recent Labs  Lab 05/04/22 0956 05/06/22 0523  WBC 9.3 8.8  NEUTROABS 6.5  --   HGB 12.1 11.7*  HCT 37.7 35.4*  MCV 92.2 90.8  PLT 398 370   Basic Metabolic Panel: Recent Labs  Lab 05/04/22 0956 05/06/22 0520 05/06/22 0523  NA 142  --  139  K 4.0  --  3.4*  CL 108  --  113*  CO2 26  --  21*  GLUCOSE 96  --  87  BUN 29*  --  17  CREATININE 0.92  --  0.74  CALCIUM 9.8  --  9.7  MG  --  2.0  --    Liver Function Tests: Recent Labs  Lab 05/04/22 0956  AST 27  ALT 14  ALKPHOS 35*  BILITOT 1.1  PROT 6.8  ALBUMIN 3.7   CBG: Recent Labs  Lab 05/04/22 0946  GLUCAP 88    Discharge time spent: greater than 30 minutes.  Signed: Delfino Lovett, MD [facility] 05/08/2022

## 2022-11-13 DEATH — deceased
